# Patient Record
Sex: Female | Born: 1950 | Race: White | Hispanic: No | Marital: Married | Smoking: Never smoker
Health system: Southern US, Community
[De-identification: ages and names within clinical notes are randomized; demographics above are authoritative.]

## PROBLEM LIST (undated history)

## (undated) DIAGNOSIS — N200 Calculus of kidney: Secondary | ICD-10-CM

## (undated) DIAGNOSIS — M8580 Other specified disorders of bone density and structure, unspecified site: Secondary | ICD-10-CM

## (undated) DIAGNOSIS — C50919 Malignant neoplasm of unspecified site of unspecified female breast: Secondary | ICD-10-CM

## (undated) DIAGNOSIS — E039 Hypothyroidism, unspecified: Secondary | ICD-10-CM

## (undated) DIAGNOSIS — H269 Unspecified cataract: Secondary | ICD-10-CM

## (undated) DIAGNOSIS — M545 Low back pain, unspecified: Secondary | ICD-10-CM

## (undated) DIAGNOSIS — L719 Rosacea, unspecified: Secondary | ICD-10-CM

## (undated) DIAGNOSIS — F0390 Unspecified dementia without behavioral disturbance: Secondary | ICD-10-CM

## (undated) DIAGNOSIS — K219 Gastro-esophageal reflux disease without esophagitis: Secondary | ICD-10-CM

## (undated) HISTORY — DX: Gastro-esophageal reflux disease without esophagitis: K21.9

## (undated) HISTORY — DX: Other specified disorders of bone density and structure, unspecified site: M85.80

## (undated) HISTORY — PX: ABDOMINAL HYSTERECTOMY: SHX81

## (undated) HISTORY — DX: Low back pain, unspecified: M54.50

## (undated) HISTORY — DX: Calculus of kidney: N20.0

## (undated) HISTORY — DX: Hypothyroidism, unspecified: E03.9

## (undated) HISTORY — DX: Unspecified cataract: H26.9

## (undated) HISTORY — PX: OOPHORECTOMY: SHX86

## (undated) HISTORY — DX: Malignant neoplasm of unspecified site of unspecified female breast: C50.919

## (undated) HISTORY — DX: Low back pain: M54.5

## (undated) HISTORY — DX: Rosacea, unspecified: L71.9

---

## 1998-06-23 ENCOUNTER — Other Ambulatory Visit: Admission: RE | Admit: 1998-06-23 | Discharge: 1998-06-23 | Payer: Self-pay | Admitting: Gynecology

## 1998-08-09 ENCOUNTER — Ambulatory Visit (HOSPITAL_COMMUNITY): Admission: RE | Admit: 1998-08-09 | Discharge: 1998-08-09 | Payer: Self-pay | Admitting: Gynecology

## 1999-06-30 ENCOUNTER — Other Ambulatory Visit: Admission: RE | Admit: 1999-06-30 | Discharge: 1999-06-30 | Payer: Self-pay | Admitting: Gynecology

## 1999-09-01 ENCOUNTER — Encounter: Payer: Self-pay | Admitting: Gynecology

## 1999-09-01 ENCOUNTER — Ambulatory Visit (HOSPITAL_COMMUNITY): Admission: RE | Admit: 1999-09-01 | Discharge: 1999-09-01 | Payer: Self-pay | Admitting: Gynecology

## 2000-06-28 ENCOUNTER — Other Ambulatory Visit: Admission: RE | Admit: 2000-06-28 | Discharge: 2000-06-28 | Payer: Self-pay | Admitting: Gynecology

## 2001-04-22 ENCOUNTER — Encounter: Payer: Self-pay | Admitting: Gynecology

## 2001-04-22 ENCOUNTER — Ambulatory Visit (HOSPITAL_COMMUNITY): Admission: RE | Admit: 2001-04-22 | Discharge: 2001-04-22 | Payer: Self-pay | Admitting: Gynecology

## 2001-06-26 ENCOUNTER — Other Ambulatory Visit: Admission: RE | Admit: 2001-06-26 | Discharge: 2001-06-26 | Payer: Self-pay | Admitting: Gynecology

## 2001-07-01 ENCOUNTER — Encounter: Payer: Self-pay | Admitting: Gynecology

## 2001-07-01 ENCOUNTER — Encounter: Admission: RE | Admit: 2001-07-01 | Discharge: 2001-07-01 | Payer: Self-pay | Admitting: Gynecology

## 2001-08-20 ENCOUNTER — Encounter: Admission: RE | Admit: 2001-08-20 | Discharge: 2001-11-18 | Payer: Self-pay | Admitting: Gynecology

## 2002-04-24 ENCOUNTER — Encounter: Payer: Self-pay | Admitting: Gynecology

## 2002-04-24 ENCOUNTER — Ambulatory Visit (HOSPITAL_COMMUNITY): Admission: RE | Admit: 2002-04-24 | Discharge: 2002-04-24 | Payer: Self-pay | Admitting: Gynecology

## 2002-07-14 ENCOUNTER — Other Ambulatory Visit: Admission: RE | Admit: 2002-07-14 | Discharge: 2002-07-14 | Payer: Self-pay | Admitting: Gynecology

## 2003-04-27 ENCOUNTER — Encounter: Payer: Self-pay | Admitting: Gynecology

## 2003-04-27 ENCOUNTER — Ambulatory Visit (HOSPITAL_COMMUNITY): Admission: RE | Admit: 2003-04-27 | Discharge: 2003-04-27 | Payer: Self-pay | Admitting: Gynecology

## 2003-07-09 ENCOUNTER — Ambulatory Visit (HOSPITAL_COMMUNITY): Admission: RE | Admit: 2003-07-09 | Discharge: 2003-07-09 | Payer: Self-pay | Admitting: Gastroenterology

## 2003-07-29 ENCOUNTER — Other Ambulatory Visit: Admission: RE | Admit: 2003-07-29 | Discharge: 2003-07-29 | Payer: Self-pay | Admitting: Gynecology

## 2004-05-11 ENCOUNTER — Ambulatory Visit (HOSPITAL_COMMUNITY): Admission: RE | Admit: 2004-05-11 | Discharge: 2004-05-11 | Payer: Self-pay | Admitting: Gynecology

## 2004-08-01 ENCOUNTER — Other Ambulatory Visit: Admission: RE | Admit: 2004-08-01 | Discharge: 2004-08-01 | Payer: Self-pay | Admitting: Gynecology

## 2004-08-18 ENCOUNTER — Encounter: Admission: RE | Admit: 2004-08-18 | Discharge: 2004-08-18 | Payer: Self-pay | Admitting: Gynecology

## 2005-05-14 ENCOUNTER — Ambulatory Visit (HOSPITAL_COMMUNITY): Admission: RE | Admit: 2005-05-14 | Discharge: 2005-05-14 | Payer: Self-pay | Admitting: Gynecology

## 2005-07-18 ENCOUNTER — Encounter: Admission: RE | Admit: 2005-07-18 | Discharge: 2005-07-18 | Payer: Self-pay | Admitting: Gynecology

## 2005-08-07 ENCOUNTER — Other Ambulatory Visit: Admission: RE | Admit: 2005-08-07 | Discharge: 2005-08-07 | Payer: Self-pay | Admitting: Gynecology

## 2006-07-18 ENCOUNTER — Ambulatory Visit (HOSPITAL_COMMUNITY): Admission: RE | Admit: 2006-07-18 | Discharge: 2006-07-18 | Payer: Self-pay | Admitting: Gynecology

## 2006-08-08 ENCOUNTER — Ambulatory Visit (HOSPITAL_COMMUNITY): Admission: RE | Admit: 2006-08-08 | Discharge: 2006-08-08 | Payer: Self-pay | Admitting: Gynecology

## 2006-08-14 ENCOUNTER — Other Ambulatory Visit: Admission: RE | Admit: 2006-08-14 | Discharge: 2006-08-14 | Payer: Self-pay | Admitting: Gynecology

## 2006-08-16 ENCOUNTER — Ambulatory Visit (HOSPITAL_COMMUNITY): Admission: RE | Admit: 2006-08-16 | Discharge: 2006-08-16 | Payer: Self-pay | Admitting: Gynecology

## 2006-12-13 ENCOUNTER — Emergency Department (HOSPITAL_COMMUNITY): Admission: EM | Admit: 2006-12-13 | Discharge: 2006-12-13 | Payer: Self-pay | Admitting: Family Medicine

## 2007-02-11 ENCOUNTER — Emergency Department (HOSPITAL_COMMUNITY): Admission: EM | Admit: 2007-02-11 | Discharge: 2007-02-11 | Payer: Self-pay | Admitting: Family Medicine

## 2007-02-24 ENCOUNTER — Emergency Department (HOSPITAL_COMMUNITY): Admission: EM | Admit: 2007-02-24 | Discharge: 2007-02-24 | Payer: Self-pay | Admitting: Family Medicine

## 2007-07-24 ENCOUNTER — Ambulatory Visit (HOSPITAL_COMMUNITY): Admission: RE | Admit: 2007-07-24 | Discharge: 2007-07-24 | Payer: Self-pay | Admitting: Gynecology

## 2007-08-27 ENCOUNTER — Other Ambulatory Visit: Admission: RE | Admit: 2007-08-27 | Discharge: 2007-08-27 | Payer: Self-pay | Admitting: Gynecology

## 2007-09-03 ENCOUNTER — Encounter: Admission: RE | Admit: 2007-09-03 | Discharge: 2007-09-03 | Payer: Self-pay | Admitting: Gastroenterology

## 2008-04-26 ENCOUNTER — Ambulatory Visit (HOSPITAL_COMMUNITY): Admission: RE | Admit: 2008-04-26 | Discharge: 2008-04-26 | Payer: Self-pay | Admitting: Gastroenterology

## 2008-04-26 ENCOUNTER — Encounter (INDEPENDENT_AMBULATORY_CARE_PROVIDER_SITE_OTHER): Payer: Self-pay | Admitting: Gastroenterology

## 2008-08-13 ENCOUNTER — Emergency Department (HOSPITAL_COMMUNITY): Admission: EM | Admit: 2008-08-13 | Discharge: 2008-08-13 | Payer: Self-pay | Admitting: Family Medicine

## 2008-08-25 ENCOUNTER — Ambulatory Visit (HOSPITAL_COMMUNITY): Admission: RE | Admit: 2008-08-25 | Discharge: 2008-08-25 | Payer: Self-pay | Admitting: Gynecology

## 2008-09-02 ENCOUNTER — Encounter: Payer: Self-pay | Admitting: Gynecology

## 2008-09-02 ENCOUNTER — Other Ambulatory Visit: Admission: RE | Admit: 2008-09-02 | Discharge: 2008-09-02 | Payer: Self-pay | Admitting: Gynecology

## 2008-09-02 ENCOUNTER — Ambulatory Visit: Payer: Self-pay | Admitting: Gynecology

## 2009-04-01 HISTORY — PX: US ECHOCARDIOGRAPHY: HXRAD669

## 2009-10-12 ENCOUNTER — Ambulatory Visit (HOSPITAL_COMMUNITY): Admission: RE | Admit: 2009-10-12 | Discharge: 2009-10-12 | Payer: Self-pay | Admitting: Gynecology

## 2009-11-03 ENCOUNTER — Other Ambulatory Visit: Admission: RE | Admit: 2009-11-03 | Discharge: 2009-11-03 | Payer: Self-pay | Admitting: Gynecology

## 2009-11-03 ENCOUNTER — Ambulatory Visit: Payer: Self-pay | Admitting: Gynecology

## 2009-11-07 ENCOUNTER — Encounter: Admission: RE | Admit: 2009-11-07 | Discharge: 2009-11-22 | Payer: Self-pay | Admitting: Internal Medicine

## 2011-02-01 ENCOUNTER — Other Ambulatory Visit: Payer: Self-pay | Admitting: Gynecology

## 2011-02-01 DIAGNOSIS — Z1231 Encounter for screening mammogram for malignant neoplasm of breast: Secondary | ICD-10-CM

## 2011-02-09 ENCOUNTER — Ambulatory Visit (HOSPITAL_COMMUNITY)
Admission: RE | Admit: 2011-02-09 | Discharge: 2011-02-09 | Disposition: A | Discharge status: Home / Self Care | Payer: 59 | Source: Ambulatory Visit | Attending: Gynecology | Admitting: Gynecology

## 2011-02-09 DIAGNOSIS — Z1231 Encounter for screening mammogram for malignant neoplasm of breast: Secondary | ICD-10-CM | POA: Insufficient documentation

## 2011-02-13 ENCOUNTER — Encounter (INDEPENDENT_AMBULATORY_CARE_PROVIDER_SITE_OTHER): Payer: 59

## 2011-02-13 DIAGNOSIS — Z13820 Encounter for screening for osteoporosis: Secondary | ICD-10-CM

## 2011-02-13 NOTE — Op Note (Signed)
NAMEBRYSON, PALEN              ACCOUNT NO.:  [id number]   MEDICAL RECORD NO.:  [id number]          PATIENT TYPE:  AMB   LOCATION:  ENDO                         FACILITY:  [facility]   PHYSICIAN:  Anselmo Rod, M.D.  DATE OF BIRTH:  12-15-1950   DATE OF PROCEDURE:  04/26/2008  DATE OF DISCHARGE:                               OPERATIVE REPORT   PROCEDURE PERFORMED:  Esophagogastroduodenoscopy with multiple cold  biopsies.   ENDOSCOPIST:  Anselmo Rod, M.D.   INSTRUMENT USED:  Pentax video panendoscope.   INDICATIONS FOR PROCEDURE:  A 60 year old white female with a history of  reflux and some retrosternal discomfort undergoing EGD to rule out  peptic ulcer disease, esophagitis, gastritis, etc.   PREPROCEDURE PREPARATION:  Informed consent was procured from the  patient.  The patient fasted for 8 hours prior to the procedure.  Risks  and benefits of the procedure were discussed with the patient in detail.   PREPROCEDURE PHYSICAL:  VITAL SIGNS:  The patient had stable vital  signs.  NECK:  Supple.  CHEST:  Clear to auscultation.  HEART:  S1 and S2 regular.  ABDOMEN:  Soft with normal bowel sounds.   DESCRIPTION OF PROCEDURE:  The patient was placed in the left lateral  decubitus position and sedated with 50 mcg of Fentanyl and 5 mg of  Versed given intravenously in slow incremental doses.  Once the patient  was adequately sedated and maintained on low-flow oxygen and continuous  cardiac monitoring, the Pentax video panendoscope was advanced through  the mouthpiece, over the tongue and into the esophagus under direct  vision.  The vocal cords appeared healthy.  The entire esophagus was  widely patent with healthy-appearing Z-line.  No erosions, ulcerations,  esophagitis or Barrett's mucosa was identified.  The scope was then  advanced into the stomach.  Multiple small sessile polyps were seen in  the proximal stomach.  Some of these were biopsied for pathology.  Antral  gastritis was noted close to the pylorus.  Biopsies were done to  rule out presence of H. pylori by pathology.  The proximal small bowel  appeared normal.  There was no outlet obstruction.  The patient  tolerated the procedure well without complications.  Retroflexion in the  high cardia revealed no evidence of hiatal hernia.   IMPRESSION:  1. Healthy appearing vocal cords.  2. Widely patent esophagus.  3. Multiple small sessile polyps in the proximal stomach.  Biopsies      done.  4. Antral gastritis.  Biopsies done for Helicobacter pylori.  5. Normal proximal small bowel.   RECOMMENDATIONS:  1. Continue PPI.  2. Avoid all nonsteroidals for now.  3. Treat with antibiotics if there is evidence of H. pylori in      pathology.  4. Outpatient follow-up as need arises in the future.      Anselmo Rod, M.D.  Electronically Signed     JNM/MEDQ  D:  04/26/2008  T:  04/26/2008  Job:  [id number]   cc:   Talmadge Coventry, M.D.  Fax: [phone]

## 2011-02-14 ENCOUNTER — Other Ambulatory Visit (INDEPENDENT_AMBULATORY_CARE_PROVIDER_SITE_OTHER): Payer: 59

## 2011-02-14 DIAGNOSIS — M949 Disorder of cartilage, unspecified: Secondary | ICD-10-CM

## 2011-02-16 NOTE — Op Note (Signed)
   NAME:  Deborah Jones, Deborah Jones                        ACCOUNT NO.:  [id number]   MEDICAL RECORD NO.:  [id number]                   PATIENT TYPE:  AMB   LOCATION:  ENDO                                 FACILITY:  [facility]   PHYSICIAN:  Anselmo Rod, M.D.               DATE OF BIRTH:  1951-09-19   DATE OF PROCEDURE:  07/09/2003  DATE OF DISCHARGE:                                 OPERATIVE REPORT   PROCEDURE PERFORMED:  Screening colonoscopy.   ENDOSCOPIST:  Anselmo Rod, M.D.   INSTRUMENT USED:  Olympus video colonoscope.   INDICATION FOR PROCEDURE:  Family history of colon cancer in a 60 year old  white female undergoing screening colonoscopy.  Rule out colonic polyps,  masses, etc.   PREPROCEDURE PREPARATION:  Informed consent was procured from the patient.  The patient was fasted for eight hours prior to the procedure and prepped  with a bottle of Miralax and Gatorade the night prior to the procedure.   PREPROCEDURE PHYSICAL:  VITAL SIGNS:  The patient had stable vital signs.  NECK:  Supple.  CHEST:  Clear to auscultation.  S1, S2 regular.  ABDOMEN:  Soft with normal bowel sounds.   DESCRIPTION OF PROCEDURE:  The patient was placed in the left lateral  decubitus position and sedated with 50 mg of Demerol and 5 mg of Versed  intravenously.  Once the patient was adequately sedate and maintained on low-  flow oxygen and continuous cardiac monitoring, the Olympus video colonoscope  was advanced from the rectum to the cecum and terminal ileum with some  difficulty.  There was some residual stool in the colon, especially on the  right side.  Multiple washes were done.  The patient's position was changed  from the left lateral to the supine position to mobilize the stool pool.  The appendiceal orifice and the ileocecal valve were clearly visualized.  The terminal ileum appeared normal.  Except for small internal hemorrhoids  seen on retroflexion in the rectum, the entire colonic mucosa  up to the  terminal ileum appeared normal.   IMPRESSION:  Essentially normal colonoscopy including the terminal ileum,  except for small, nonbleeding internal hemorrhoids seen on retroflexion.   RECOMMENDATIONS:  1. Repeat CRC screening in the next five years unless the patient develops     any abnormal symptoms in the interim.  2. A high-fiber diet with liberal fluid intake.  3. Outpatient follow-up as the need arises in the future.                                               Anselmo Rod, M.D.    JNM/MEDQ  D:  07/09/2003  T:  07/09/2003  Job:  [id number]

## 2011-02-20 ENCOUNTER — Other Ambulatory Visit: Payer: Self-pay | Admitting: Gynecology

## 2011-02-20 ENCOUNTER — Encounter (INDEPENDENT_AMBULATORY_CARE_PROVIDER_SITE_OTHER): Payer: 59 | Admitting: Gynecology

## 2011-02-20 ENCOUNTER — Other Ambulatory Visit (HOSPITAL_COMMUNITY)
Admission: RE | Admit: 2011-02-20 | Discharge: 2011-02-20 | Disposition: A | Discharge status: Home / Self Care | Payer: 59 | Source: Ambulatory Visit | Attending: Gynecology | Admitting: Gynecology

## 2011-02-20 DIAGNOSIS — Z1211 Encounter for screening for malignant neoplasm of colon: Secondary | ICD-10-CM

## 2011-02-20 DIAGNOSIS — Z01419 Encounter for gynecological examination (general) (routine) without abnormal findings: Secondary | ICD-10-CM

## 2011-02-20 DIAGNOSIS — Z124 Encounter for screening for malignant neoplasm of cervix: Secondary | ICD-10-CM | POA: Insufficient documentation

## 2012-01-23 ENCOUNTER — Encounter: Payer: Self-pay | Admitting: *Deleted

## 2012-05-01 ENCOUNTER — Other Ambulatory Visit: Payer: Self-pay | Admitting: Gynecology

## 2012-05-01 DIAGNOSIS — Z1231 Encounter for screening mammogram for malignant neoplasm of breast: Secondary | ICD-10-CM

## 2012-05-16 ENCOUNTER — Ambulatory Visit (HOSPITAL_COMMUNITY)
Admission: RE | Admit: 2012-05-16 | Discharge: 2012-05-16 | Disposition: A | Discharge status: Home / Self Care | Payer: 59 | Source: Ambulatory Visit | Attending: Gynecology | Admitting: Gynecology

## 2012-05-16 DIAGNOSIS — Z1231 Encounter for screening mammogram for malignant neoplasm of breast: Secondary | ICD-10-CM | POA: Insufficient documentation

## 2012-06-24 ENCOUNTER — Other Ambulatory Visit (HOSPITAL_COMMUNITY)
Admission: RE | Admit: 2012-06-24 | Discharge: 2012-06-24 | Disposition: A | Discharge status: Home / Self Care | Payer: 59 | Source: Ambulatory Visit | Attending: Gynecology | Admitting: Gynecology

## 2012-06-24 ENCOUNTER — Encounter: Payer: Self-pay | Admitting: Gynecology

## 2012-06-24 ENCOUNTER — Ambulatory Visit (INDEPENDENT_AMBULATORY_CARE_PROVIDER_SITE_OTHER): Payer: 59 | Admitting: Gynecology

## 2012-06-24 VITALS — BP 114/70 | Ht 66.75 in | Wt 130.0 lb

## 2012-06-24 DIAGNOSIS — N952 Postmenopausal atrophic vaginitis: Secondary | ICD-10-CM | POA: Insufficient documentation

## 2012-06-24 DIAGNOSIS — Z01419 Encounter for gynecological examination (general) (routine) without abnormal findings: Secondary | ICD-10-CM

## 2012-06-24 DIAGNOSIS — M8580 Other specified disorders of bone density and structure, unspecified site: Secondary | ICD-10-CM | POA: Insufficient documentation

## 2012-06-24 DIAGNOSIS — M899 Disorder of bone, unspecified: Secondary | ICD-10-CM

## 2012-06-24 DIAGNOSIS — Z1151 Encounter for screening for human papillomavirus (HPV): Secondary | ICD-10-CM | POA: Insufficient documentation

## 2012-06-24 MED ORDER — ESTRADIOL 0.1 MG/GM VA CREA: 2.0000 g | TOPICAL_CREAM | VAGINAL | Status: DC

## 2012-06-24 NOTE — Progress Notes (Signed)
Deborah Jones 1951-08-16 [id number]   History:    61 y.o.  for annual gyn exam with no complaints today. Patient was recently started on pravastatin 20 mg daily by her primary physician. Review of her record indicated her mammogram was in August of this year which was normal. She frequently does her self breast examination. Her last bone density study was in may of 2012 with her lowest T score of -1.5 at the left femoral neck (decreased bone mineralization /osteopenia) with normal Frax analysis. Patient's currently taking vitamin D 3 1000 units daily. Her colonoscopy was 9 years ago no abnormalities detected. Her primary physician had received her Hemoccult cards that she had submitted to the office. Patient stated that about 20 years ago she had atypical squamous cells of undetermined significance on her Pap smear which on followup cleared. Patient with past history of total abdominal hysterectomy with bilateral salpingectomies and right salpingo-oophorectomy for endometriosis. Patient is using Estrace vaginal cream twice a week for vaginal atrophy.  Past medical history,surgical history, family history and social history were all reviewed and documented in the EPIC chart.  Gynecologic History No LMP recorded. Patient has had a hysterectomy. Contraception: none and Menopausal Last Pap: 2012. Results were: normal Last mammogram: 2013. Results were: normal  Obstetric History OB History    Grav Para Term Preterm Abortions TAB SAB Ect Mult Living   0                ROS: A ROS was performed and pertinent positives and negatives are included in the history.  GENERAL: No fevers or chills. HEENT: No change in vision, no earache, sore throat or sinus congestion. NECK: No pain or stiffness. CARDIOVASCULAR: No chest pain or pressure. No palpitations. PULMONARY: No shortness of breath, cough or wheeze. GASTROINTESTINAL: No abdominal pain, nausea, vomiting or diarrhea, melena or bright red blood per  rectum. GENITOURINARY: No urinary frequency, urgency, hesitancy or dysuria. MUSCULOSKELETAL: No joint or muscle pain, no back pain, no recent trauma. DERMATOLOGIC: No rash, no itching, no lesions. ENDOCRINE: No polyuria, polydipsia, no heat or cold intolerance. No recent change in weight. HEMATOLOGICAL: No anemia or easy bruising or bleeding. NEUROLOGIC: No headache, seizures, numbness, tingling or weakness. PSYCHIATRIC: No depression, no loss of interest in normal activity or change in sleep pattern.     Exam: chaperone present  BP 114/70  Ht 5' 6.75" (1.695 m)  Wt 130 lb (58.968 kg)  BMI 20.51 kg/m2  Body mass index is 20.51 kg/(m^2).  General appearance : Well developed well nourished female. No acute distress HEENT: Neck supple, trachea midline, no carotid bruits, no thyroidmegaly Lungs: Clear to auscultation, no rhonchi or wheezes, or rib retractions  Heart: Regular rate and rhythm, no murmurs or gallops Breast:Examined in sitting and supine position were symmetrical in appearance, no palpable masses or tenderness,  no skin retraction, no nipple inversion, no nipple discharge, no skin discoloration, no axillary or supraclavicular lymphadenopathy Abdomen: no palpable masses or tenderness, no rebound or guarding Extremities: no edema or skin discoloration or tenderness  Pelvic:  Bartholin, Urethra, Skene Glands: Within normal limits             Vagina: No gross lesions or discharge  Cervix: Absent  Uterus absent  Adnexa  Without masses or tenderness  Anus and perineum  normal   Rectovaginal  normal sphincter tone without palpated masses or tenderness             Hemoccult not done  Assessment/Plan:  61 y.o. female for annual exam who has done well with Estrace vaginal cream twice a week for vaginal atrophy. Patient's primary physician recently did her lab work so no lab work was done today. Pap smear was done today and I have explained to the patient that after today she will  no longer need Pap smears according to the new guidelines. She was encouraged to do her monthly self breast examination. We discussed importance of weightbearing exercises for osteoporosis prevention and she's now exercise again running regularly. Next year she will need a colonoscopy and mammogram..    Ok Edwards MD, 5:36 PM 06/24/2012

## 2012-06-24 NOTE — Patient Instructions (Addendum)
Health Maintenance, 77- to 60-Year-Old SCHOOL PERFORMANCE After high school completion, the young adult may be attending college, Scientist, product/process development or vocational school, or entering the [employer] or the work force. SOCIAL AND EMOTIONAL DEVELOPMENT The young adult establishes adult relationships and explores sexual identity. Young adults may be living at home or in a college dorm or apartment. Increasing independence is important with young adults. Throughout adolescence, teens should assume responsibility of their own health care. IMMUNIZATIONS Most young adults should be fully vaccinated. A booster dose of Tdap (tetanus, diphtheria, and pertussis, or "whooping cough"), a dose of meningococcal vaccine to protect against a certain type of bacterial meningitis, hepatitis A, human papillomarvirus (HPV), chickenpox, or measles vaccines may be indicated, if not given at an earlier age. Annual influenza or "flu" vaccination should be considered during flu season.  TESTING Annual screening for vision and hearing problems is recommended. Vision should be screened objectively at least once between 44 and 60 years of age. The young adult may be screened for anemia or tuberculosis. Young adults should have a blood test to check for high cholesterol during this time period. Young adults should be screened for use of alcohol and drugs. If the young adult is sexually active, screening for sexually transmitted infections, pregnancy, or HIV may be performed. Screening for cervical cancer should be performed within 3 years of beginning sexual activity. NUTRITION AND ORAL HEALTH  Adequate calcium intake is important. Consume 3 servings of low-fat milk and dairy products daily. For those who do not drink milk or consume dairy products, calcium enriched foods, such as juice, bread, or cereal, dark, leafy greens, or canned fish are alternate sources of calcium.   Drink plenty of water. Limit fruit juice to 8 to 12 ounces per day.  Avoid sugary beverages or sodas.   Discourage skipping meals, especially breakfast. Teens should eat a good variety of vegetables and fruits, as well as lean meats.   Avoid high fat, high salt, and high sugar foods, such as candy, chips, and cookies.   Encourage young adults to participate in meal planning and preparation.   Eat meals together as a family whenever possible. Encourage conversation at mealtime.   Limit fast food choices and eating out at restaurants.   Brush teeth twice a day and floss.   Schedule dental exams twice a year.  SLEEP Regular sleep habits are important. PHYSICAL, SOCIAL, AND EMOTIONAL DEVELOPMENT  One hour of regular physical activity daily is recommended. Continue to participate in sports.   Encourage young adults to develop their own interests and consider community service or volunteerism.   Provide guidance to the young adult in making decisions about college and work plans.   Make sure that young adults know that they should never be in a situation that makes them uncomfortable, and they should tell partners if they do not want to engage in sexual activity.   Talk to the young adult about body image. Eating disorders may be noted at this time. Young adults may also be concerned about being overweight. Monitor the young adult for weight gain or loss.   Mood disturbances, depression, anxiety, alcoholism, or attention problems may be noted in young adults. Talk to the caregiver if there are concerns about mental illness.   Negotiate limit setting and independent decision making.   Encourage the young adult to handle conflict without physical violence.   Avoid loud noises which may impair hearing.   Limit television and computer time to 2 hours per day.  Individuals who engage in excessive sedentary activity are more likely to become overweight.  RISK BEHAVIORS  Sexually active young adults need to take precautions against pregnancy and sexually  transmitted infections. Talk to young adults about contraception.   Provide a tobacco-free and drug-free environment for the young adult. Talk to the young adult about drug, tobacco, and alcohol use among friends or at friends' homes. Make sure the young adult knows that smoking tobacco or marijuana and taking drugs have health consequences and may impact brain development.   Teach the young adult about appropriate use of over-the-counter or prescription medicines.   Establish guidelines for driving and for riding with friends.   Talk to young adults about the risks of drinking and driving or boating. Encourage the young adult to call you if he or she or friends have been drinking or using drugs.   Remind young adults to wear seat belts at all times in cars and life vests in boats.   Young adults should always wear a properly fitted helmet when they are riding a bicycle.   Use caution with all-terrain vehicles (ATVs) or other motorized vehicles.   Do not keep handguns in the home. (If you do, the gun and ammunition should be locked separately and out of the young adult's access.)   Equip your home with smoke detectors and change the batteries regularly. Make sure all family members know the fire escape plans for your home.   Teach young adults not to swim alone and not to dive in shallow water.   All individuals should wear sunscreen that protects against UVA and UVB light with at least a sun protection factor (SPF) of 30 when out in the sun. This minimizes sun burning.  WHAT'S NEXT? Young adults should visit their pediatrician or family physician yearly. By young adulthood, health care should be transitioned to a family physician or internal medicine specialist. Sexually active females may want to begin annual physical exams with a gynecologist. Document Released: 12/13/2006 Document Revised: 09/06/2011 Document Reviewed: 01/02/2007 [facility] Patient Information 2012 [place],  [place].  Breast Self-Exam A self breast exam may help you find changes or problems while they are still small. Do a breast self-exam:  Every month.   One week after your period (menstrual period).   On the first day of each month if you do not have periods anymore.  Look for any:  Change in breast color, size, or shape.   Dimples in your breast.   Changes in your nipples or skin.   Dry skin on your breasts or nipples.   Watery or bloody discharge from your nipples.   Feel for:  Lumps.   Thick, hard places.   Any other changes.  HOME CARE There are 3 ways to do the breast self-exam: In front of a mirror.  Lift your arms over your head and turn side to side.   Put your hands on your hips and lean down, then turn from side to side.   Bend forward and turn from side to side.  In the shower.  With soapy hands, check both breasts. Then check above and below your collarbone and your armpits.   Feel above and below your collarbone down to under your breast, and from the center of your chest to the outer edge of the armpit. Check for any lumps or hard spots.   Using the tips of your middle three fingers check your whole breast by pressing your hand over your breast in  a circle or in an up and down motion.  Lying down.  Lie flat on your bed.   Put a small pillow under the breast you are going to check. On that same side, put your hand behind your head.   With your other hand, use the 3 middle fingers to feel the breast.   Move your fingers in a circle around the breast. Press firmly over all parts of the breast to feel for any lumps.  GET HELP RIGHT AWAY IF: You find any changes in your breasts so they can be checked. Document Released: 03/05/2008 Document Revised: 09/06/2011 Document Reviewed: 01/05/2009 [facility] Patient Information 2012 [place], [place].

## 2012-06-30 ENCOUNTER — Encounter: Payer: Self-pay | Admitting: Gynecology

## 2013-04-30 ENCOUNTER — Encounter: Payer: Self-pay | Admitting: Cardiology

## 2013-05-08 ENCOUNTER — Other Ambulatory Visit: Payer: Self-pay | Admitting: Gynecology

## 2013-05-08 DIAGNOSIS — Z1231 Encounter for screening mammogram for malignant neoplasm of breast: Secondary | ICD-10-CM

## 2013-05-19 ENCOUNTER — Ambulatory Visit (HOSPITAL_COMMUNITY)
Admission: RE | Admit: 2013-05-19 | Discharge: 2013-05-19 | Disposition: A | Discharge status: Home / Self Care | Payer: 59 | Source: Ambulatory Visit | Attending: Gynecology | Admitting: Gynecology

## 2013-05-19 DIAGNOSIS — Z1231 Encounter for screening mammogram for malignant neoplasm of breast: Secondary | ICD-10-CM | POA: Insufficient documentation

## 2013-06-30 ENCOUNTER — Encounter: Payer: 59 | Admitting: Gynecology

## 2013-07-02 ENCOUNTER — Encounter: Payer: Self-pay | Admitting: Gynecology

## 2013-07-09 ENCOUNTER — Ambulatory Visit (INDEPENDENT_AMBULATORY_CARE_PROVIDER_SITE_OTHER): Payer: 59 | Admitting: Gynecology

## 2013-07-09 ENCOUNTER — Encounter: Payer: Self-pay | Admitting: Gynecology

## 2013-07-09 VITALS — BP 114/72 | Ht 66.75 in | Wt 128.0 lb

## 2013-07-09 DIAGNOSIS — Z1159 Encounter for screening for other viral diseases: Secondary | ICD-10-CM

## 2013-07-09 DIAGNOSIS — M8580 Other specified disorders of bone density and structure, unspecified site: Secondary | ICD-10-CM

## 2013-07-09 DIAGNOSIS — Z79890 Hormone replacement therapy (postmenopausal): Secondary | ICD-10-CM

## 2013-07-09 DIAGNOSIS — Z01419 Encounter for gynecological examination (general) (routine) without abnormal findings: Secondary | ICD-10-CM

## 2013-07-09 DIAGNOSIS — N952 Postmenopausal atrophic vaginitis: Secondary | ICD-10-CM

## 2013-07-09 DIAGNOSIS — M899 Disorder of bone, unspecified: Secondary | ICD-10-CM

## 2013-07-09 MED ORDER — ESTRADIOL 0.1 MG/GM VA CREA: 2.0000 g | TOPICAL_CREAM | VAGINAL | Status: DC

## 2013-07-09 NOTE — Progress Notes (Signed)
Deborah Jones 1950/10/21 [id number]   History:    62 y.o.  for annual gyn exam who has been complaining of bloating and gas-like symptoms especially after eating. Review of her record indicated that she had endoscopy approximately 5 years ago and erosive gastritis and gastric polyp had been removed. Patient had informed me that prior to that she was treated for H. Pylori. Her last colonoscopy was normal in 2010. Patient denies any hematochezia. Patient's PCP is Dr. Jarold Motto who is treating the patient for hyperlipidemia for which she's currently on pravastatin 20 mg daily. He has been doing her lab work. 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 Recent mammogram August this year was normal.Her last bone density study was in may of 2012 with her lowest T score of -1.5 at the left femoral neck (decreased bone mineralization /osteopenia) with normal Frax analysis.Patient stated that about 20 years ago she had atypical squamous cells of undetermined significance on her Pap smear with followup Pap smears being normal.Patient with past history of total abdominal hysterectomy with bilateral salpingectomies and right salpingo-oophorectomy for endometriosis. Patient is using Estrace vaginal cream twice a week for vaginal atrophy.        Past medical history,surgical history, family history and social history were all reviewed and documented in the EPIC chart.  Gynecologic History No LMP recorded. Patient has had a hysterectomy. Contraception: status post hysterectomy Last Pap: 2013. Results were: normal Last mammogram: 2014. Results were: normal  Obstetric History OB History  Gravida Para Term Preterm AB SAB TAB Ectopic Multiple Living  0                  ROS: A ROS was performed and pertinent positives and negatives are included in the history.  GENERAL: No fevers or chills. HEENT: No change in vision, no earache, sore throat or sinus congestion. NECK: No pain or  stiffness. CARDIOVASCULAR: No chest pain or pressure. No palpitations. PULMONARY: No shortness of breath, cough or wheeze. GASTROINTESTINAL: No abdominal pain, nausea, vomiting or diarrhea, melena or bright red blood per rectum. GENITOURINARY: No urinary frequency, urgency, hesitancy or dysuria. MUSCULOSKELETAL: No joint or muscle pain, no back pain, no recent trauma. DERMATOLOGIC: No rash, no itching, no lesions. ENDOCRINE: No polyuria, polydipsia, no heat or cold intolerance. No recent change in weight. HEMATOLOGICAL: No anemia or easy bruising or bleeding. NEUROLOGIC: No headache, seizures, numbness, tingling or weakness. PSYCHIATRIC: No depression, no loss of interest in normal activity or change in sleep pattern.     Exam: chaperone present  BP 114/72  Ht 5' 6.75" (1.695 m)  Wt 128 lb (58.06 kg)  BMI 20.21 kg/m2  Body mass index is 20.21 kg/(m^2).  General appearance : Well developed well nourished female. No acute distress HEENT: Neck supple, trachea midline, no carotid bruits, no thyroidmegaly Lungs: Clear to auscultation, no rhonchi or wheezes, or rib retractions  Heart: Regular rate and rhythm, no murmurs or gallops Breast:Examined in sitting and supine position were symmetrical in appearance, no palpable masses or tenderness,  no skin retraction, no nipple inversion, no nipple discharge, no skin discoloration, no axillary or supraclavicular lymphadenopathy Abdomen: no palpable masses or tenderness, no rebound or guarding Extremities: no edema or skin discoloration or tenderness  Pelvic:  Bartholin, Urethra, Skene Glands: Within normal limits       Vagina: No gross lesions or discharge  Cervix:absent  Uterus Absent  Adnexa  Without masses or tenderness  Anus and perineum  normal   Rectovaginal  normal sphincter tone without palpated masses or tenderness             Hemoccult PCP provides     Assessment/Plan:  62 y.o. female for annual exam doing well on Estrace vaginal cream  twice a week. Patient's PCP has been during her lab work. Patient with history of osteopenia followup bone density study will be scheduled here at our office the next 2 weeks.  New CDC guidelines is recommending patients be tested once in her lifetime for hepatitis C antibody who were born between [birth year] through 1964. This was discussed with the patient today and has agreed to be tested today.  The patient was reminded to followup with her gastroenterologist in reference to her abdominal bloating after meals. Patient with prior history of H. Pylori as well as gastritis and gastric polyp. If the workup is negative by the gastroenterologist I recommend we followup with a pelvic ultrasound to look at her main ovary. No pelvic abnormality on exam today. Prescription refill for the Estrace vaginal cream was provided. We discussed importance of calcium and vitamin D with regular exercise for osteoporosis prevention.  Note: This dictation was prepared with  Dragon/digital dictation along withSmart phrase technology. Any transcriptional errors that result from this process are unintentional.   Ok Edwards MD, 12:00 PM 07/09/2013

## 2013-07-09 NOTE — Patient Instructions (Signed)
Bone Densitometry Bone densitometry is a special X-ray that measures your bone density and can be used to help predict your risk of bone fractures. This test is used to determine bone mineral content and density to diagnose osteoporosis. Osteoporosis is the loss of bone that may cause the bone to become weak. Osteoporosis commonly occurs in women entering menopause. However, it may be found in men and in people with other diseases. PREPARATION FOR TEST No preparation necessary. WHO SHOULD BE TESTED?  All women older than 65.  Postmenopausal women (50 to 65) with risk factors for osteoporosis.  People with a previous fracture caused by normal activities.  People with a small body frame (less than 127 poundsor a body mass index [BMI] of less than 21).  People who have a parent with a hip fracture or history of osteoporosis.  People who smoke.  People who have rheumatoid arthritis.  Anyone who engages in excessive alcohol use (more than 3 drinks most days).  Women who experience early menopause. WHEN SHOULD YOU BE RETESTED? Current guidelines suggest that you should wait at least 2 years before doing a bone density test again if your first test was normal.Recent studies indicated that women with normal bone density may be able to wait a few years before needing to repeat a bone density test. You should discuss this with your caregiver.  NORMAL FINDINGS   Normal: less than standard deviation below normal (greater than -1).  Osteopenia: 1 to 2.5 standard deviations below normal (-1 to -2.5).  Osteoporosis: greater than 2.5 standard deviations below normal (less than -2.5). Test results are reported as a "T score" and a "Z score."The T score is a number that compares your bone density with the bone density of healthy, young women.The Z score is a number that compares your bone density with the scores of women who are the same age, gender, and race.  Ranges for normal findings may vary  among different laboratories and hospitals. You should always check with your doctor after having lab work or other tests done to discuss the meaning of your test results and whether your values are considered within normal limits. MEANING OF TEST  Your caregiver will go over the test results with you and discuss the importance and meaning of your results, as well as treatment options and the need for additional tests if necessary. OBTAINING THE TEST RESULTS It is your responsibility to obtain your test results. Ask the lab or department performing the test when and how you will get your results. Document Released: 10/09/2004 Document Revised: 12/10/2011 Document Reviewed: 11/01/2010 [employer] Patient Information 2014 [employer].  

## 2013-07-10 LAB — HEPATITIS C ANTIBODY: HCV Ab: NEGATIVE

## 2013-08-06 ENCOUNTER — Other Ambulatory Visit: Payer: Self-pay

## 2013-08-25 ENCOUNTER — Ambulatory Visit (INDEPENDENT_AMBULATORY_CARE_PROVIDER_SITE_OTHER): Payer: 59

## 2013-08-25 DIAGNOSIS — M899 Disorder of bone, unspecified: Secondary | ICD-10-CM

## 2013-08-25 DIAGNOSIS — M8580 Other specified disorders of bone density and structure, unspecified site: Secondary | ICD-10-CM

## 2013-12-15 ENCOUNTER — Other Ambulatory Visit: Payer: Self-pay | Admitting: *Deleted

## 2013-12-15 DIAGNOSIS — R140 Abdominal distension (gaseous): Secondary | ICD-10-CM

## 2014-01-06 ENCOUNTER — Encounter: Payer: Self-pay | Admitting: Gynecology

## 2014-01-06 ENCOUNTER — Ambulatory Visit (INDEPENDENT_AMBULATORY_CARE_PROVIDER_SITE_OTHER): Payer: 59

## 2014-01-06 ENCOUNTER — Ambulatory Visit (INDEPENDENT_AMBULATORY_CARE_PROVIDER_SITE_OTHER): Payer: 59 | Admitting: Gynecology

## 2014-01-06 ENCOUNTER — Other Ambulatory Visit: Payer: Self-pay | Admitting: Gynecology

## 2014-01-06 VITALS — BP 120/76

## 2014-01-06 DIAGNOSIS — R142 Eructation: Secondary | ICD-10-CM

## 2014-01-06 DIAGNOSIS — K589 Irritable bowel syndrome without diarrhea: Secondary | ICD-10-CM

## 2014-01-06 DIAGNOSIS — N9489 Other specified conditions associated with female genital organs and menstrual cycle: Secondary | ICD-10-CM

## 2014-01-06 DIAGNOSIS — R102 Pelvic and perineal pain: Secondary | ICD-10-CM

## 2014-01-06 DIAGNOSIS — N83339 Acquired atrophy of ovary and fallopian tube, unspecified side: Secondary | ICD-10-CM

## 2014-01-06 DIAGNOSIS — R140 Abdominal distension (gaseous): Secondary | ICD-10-CM

## 2014-01-06 DIAGNOSIS — R143 Flatulence: Secondary | ICD-10-CM

## 2014-01-06 DIAGNOSIS — R141 Gas pain: Secondary | ICD-10-CM

## 2014-01-06 DIAGNOSIS — Z8742 Personal history of other diseases of the female genital tract: Secondary | ICD-10-CM

## 2014-01-06 NOTE — Progress Notes (Signed)
   Patient presented to the office today to discuss her ultrasound. Patient was seen in the office on 07/09/2013 for her annual gynecological exam. Patient had been complaining of bloating and gas-like symptoms especially after eating. She recently saw her gastroenterologist and she states that she had a normal colonoscopy. Patient had history of erosive gastritis approximately 6 years ago and gastric polyp had been removed. Prior to that she been treated for H. pylori. She waited for the followup ultrasound until today until her GI evaluation was completed. She had denied any history of hematochezia.Patient with past history of total abdominal hysterectomy with bilateral salpingectomies and right salpingo-oophorectomy for endometriosis. Patient is using Estrace vaginal cream twice a week for vaginal atrophy.  Ultrasound today: Prior history of hysterectomy with right salpingo-oophorectomy and partial left salpingo-oophorectomy. Right ovary not seen. Left ovary seen but was atrophic normal echo pattern. No apparent adnexal masses on the right or left. Excessive bowel activity was noted during the exam.  Patient's symptomatology according to her gastric rod may be attributed to IBS she has been put on probe in the past and now she has informed me there going to start her on Rifaxan 550 mg 3 times a day. She will continue to followup with her gastroenterologist. I've asked her to keep a diary of her symptoms to see if she can pinpoint any particular food or drink in her diet and may be causing this bloating sensation. She is otherwise scheduled to see me at the end of the year for gynecological exam.

## 2014-06-01 ENCOUNTER — Other Ambulatory Visit: Payer: Self-pay | Admitting: Gynecology

## 2014-06-01 DIAGNOSIS — Z1231 Encounter for screening mammogram for malignant neoplasm of breast: Secondary | ICD-10-CM

## 2014-06-08 ENCOUNTER — Ambulatory Visit (HOSPITAL_COMMUNITY)
Admission: RE | Admit: 2014-06-08 | Discharge: 2014-06-08 | Disposition: A | Discharge status: Home / Self Care | Payer: 59 | Source: Ambulatory Visit | Attending: Gynecology | Admitting: Gynecology

## 2014-06-08 DIAGNOSIS — Z1231 Encounter for screening mammogram for malignant neoplasm of breast: Secondary | ICD-10-CM | POA: Diagnosis present

## 2015-06-03 ENCOUNTER — Ambulatory Visit (INDEPENDENT_AMBULATORY_CARE_PROVIDER_SITE_OTHER): Payer: 59 | Admitting: Family Medicine

## 2015-06-03 VITALS — BP 110/68 | HR 49 | Temp 97.7°F | Resp 16 | Ht 66.5 in | Wt 126.0 lb

## 2015-06-03 DIAGNOSIS — R3129 Other microscopic hematuria: Secondary | ICD-10-CM

## 2015-06-03 DIAGNOSIS — R312 Other microscopic hematuria: Secondary | ICD-10-CM | POA: Diagnosis not present

## 2015-06-03 DIAGNOSIS — R1032 Left lower quadrant pain: Secondary | ICD-10-CM | POA: Diagnosis not present

## 2015-06-03 LAB — POCT CBC
Granulocyte percent: 84.9 %G — AB (ref 37–80)
HEMATOCRIT: 39.9 % (ref 37.7–47.9)
Hemoglobin: 12.7 g/dL (ref 12.2–16.2)
LYMPH, POC: 1.1 (ref 0.6–3.4)
MCH, POC: 29.3 pg (ref 27–31.2)
MCHC: 31.9 g/dL (ref 31.8–35.4)
MCV: 92 fL (ref 80–97)
MID (cbc): 0.3 (ref 0–0.9)
MPV: 7.2 fL (ref 0–99.8)
POC GRANULOCYTE: 7.9 — AB (ref 2–6.9)
POC LYMPH PERCENT: 11.5 %L (ref 10–50)
POC MID %: 3.6 % (ref 0–12)
Platelet Count, POC: 210 10*3/uL (ref 142–424)
RBC: 4.34 M/uL (ref 4.04–5.48)
RDW, POC: 13.6 %
WBC: 9.3 10*3/uL (ref 4.6–10.2)

## 2015-06-03 LAB — BASIC METABOLIC PANEL
BUN: 22 mg/dL (ref 7–25)
CHLORIDE: 104 mmol/L (ref 98–110)
CO2: 26 mmol/L (ref 20–31)
CREATININE: 0.77 mg/dL (ref 0.50–0.99)
Calcium: 9.3 mg/dL (ref 8.6–10.4)
Glucose, Bld: 89 mg/dL (ref 65–99)
POTASSIUM: 4.8 mmol/L (ref 3.5–5.3)
SODIUM: 143 mmol/L (ref 135–146)

## 2015-06-03 LAB — POCT URINALYSIS DIPSTICK
Bilirubin, UA: NEGATIVE
Glucose, UA: NEGATIVE
KETONES UA: 15
Leukocytes, UA: NEGATIVE
Nitrite, UA: NEGATIVE
PH UA: 6
SPEC GRAV UA: 1.02
UROBILINOGEN UA: 0.2

## 2015-06-03 LAB — POCT WET PREP WITH KOH
Clue Cells Wet Prep HPF POC: NEGATIVE
KOH PREP POC: NEGATIVE
TRICHOMONAS UA: NEGATIVE
WBC WET PREP PER HPF POC: NEGATIVE
Yeast Wet Prep HPF POC: NEGATIVE

## 2015-06-03 LAB — HEMOCCULT GUIAC POC 1CARD (OFFICE): FECAL OCCULT BLD: NEGATIVE

## 2015-06-03 LAB — POCT UA - MICROSCOPIC ONLY
Bacteria, U Microscopic: NEGATIVE
CRYSTALS, UR, HPF, POC: NEGATIVE
Casts, Ur, LPF, POC: NEGATIVE
Mucus, UA: POSITIVE
WBC, Ur, HPF, POC: NEGATIVE
YEAST UA: NEGATIVE

## 2015-06-03 MED ORDER — HYDROCODONE-ACETAMINOPHEN 5-325 MG PO TABS: 1.0000 | ORAL_TABLET | ORAL | Status: DC | PRN

## 2015-06-03 MED ORDER — TAMSULOSIN HCL 0.4 MG PO CAPS: 0.4000 mg | ORAL_CAPSULE | Freq: Every day | ORAL | Status: DC

## 2015-06-03 NOTE — Progress Notes (Signed)
Subjective:    Patient ID: Deborah Jones, female    DOB: 11/09/1950, 64 y.o.   MRN: [id number] Chief Complaint  Patient presents with  . Abdominal Pain    LLQ since this morning  . Diarrhea    HPI  Woke this morning and was feeling a little blooated lower  Which is not unusal for her as she has a IBS-type bowels - both constipation and diarrhea.  This morning had a little diarrhea but prior few days bowels have been normal.  She went out walking but developing bilateral abdominal pressure which then localized to the left lower quadrant and pain was so severe - felt like a balloon was growing in her causing unbearable pressure - the pain was so severe that it was causing clammy and lightheadedness.  Took some aleve which helped so pain still present but improved. No urine sxs, no n/v. No blood in stools or melena. No gerd/pud sxs.  Did have a colonoscopy from Dr. Loreta Ave in February.  She biopsied several areas which were normal but repeat in 7 yrs as pt's grandmother had colon cancer.  In 1990s had LLQ pain that ended up causing endometriosis which was impinging on left ureter causing left hydronephrosis.  Pt has hysterectomy and Rt ovarian removal due to cyst  Pt saw gyn about her lower abd pressure about a year ago who recommended pelvic US to r/u ovarianian cancer and [place] was normal about a year aog - this was due to increasing bowel gas and gurgling.  Has tried different probiotics  Past Medical History  Diagnosis Date  . Hypothyroidism   . Rosacea   . Endometriosis   . GERD (gastroesophageal reflux disease)   . Low back pain     STARTED IN JULY 2012   Current Outpatient Prescriptions on File Prior to Visit  Medication Sig Dispense Refill  . Cholecalciferol (VITAMIN D3) 2000 UNITS TABS Take by mouth daily.    Marland Kitchen estradiol (ESTRACE) 0.1 MG/GM vaginal cream Place 0.25 Applicatorfuls vaginally 2 (two) times a week. 42.5 g 8  . levothyroxine (SYNTHROID, LEVOTHROID) 50 MCG tablet Take  50 mcg by mouth daily.    . metroNIDAZOLE (METROCREAM) 0.75 % cream Apply topically 2 (two) times daily.    . Multiple Vitamin (MULTIVITAMIN) tablet Take 1 tablet by mouth daily.    . pravastatin (PRAVACHOL) 20 MG tablet Take 20 mg by mouth daily.    . Calcium Carbonate-Vit D-Min (CALCIUM 1200 PO) Take by mouth daily.    . pantoprazole (PROTONIX) 40 MG tablet Take 40 mg by mouth as needed.    . Probiotic Product (ALIGN) 4 MG CAPS Take by mouth.     No current facility-administered medications on file prior to visit.   No Known Allergies  Review of Systems  Constitutional: Positive for chills, diaphoresis, activity change and appetite change. Negative for fever and unexpected weight change.  Respiratory: Negative for shortness of breath.   Cardiovascular: Negative for chest pain and leg swelling.  Gastrointestinal: Positive for nausea, abdominal pain, diarrhea, constipation, blood in stool and abdominal distention. Negative for vomiting.  Genitourinary: Positive for hematuria and pelvic pain. Negative for dysuria, urgency, frequency, flank pain, decreased urine volume, vaginal bleeding, vaginal discharge, enuresis, difficulty urinating, genital sores, vaginal pain and menstrual problem.  Musculoskeletal: Negative for gait problem.  Skin: Negative for rash.  Neurological: Positive for light-headedness. Negative for dizziness and syncope.  Hematological: Negative for adenopathy.  Psychiatric/Behavioral: Negative for sleep disturbance.  Objective:  BP 110/68 mmHg  Pulse 49  Temp(Src) 97.7 F (36.5 C) (Oral)  Resp 16  Ht 5' 6.5" (1.689 m)  Wt 126 lb (57.153 kg)  BMI 20.03 kg/m2  SpO2 98%  Physical Exam  Constitutional: She is oriented to person, place, and time. She appears well-developed and well-nourished. No distress.  HENT:  Head: Normocephalic and atraumatic.  Cardiovascular: Normal rate, regular rhythm, normal heart sounds and intact distal pulses.   Pulmonary/Chest:  Effort normal and breath sounds normal.  Abdominal: Soft. Normal appearance. She exhibits no distension and no mass. Bowel sounds are increased. There is no hepatosplenomegaly. There is no tenderness. There is no rigidity, no rebound, no guarding and no CVA tenderness. No hernia.  Genitourinary: Rectum normal and vagina normal. Rectal exam shows no mass and no tenderness. Guaiac negative stool. Pelvic exam was performed with patient supine. There is no rash, tenderness or lesion on the right labia. There is no rash, tenderness or lesion on the left labia. Right adnexum displays no mass, no tenderness and no fullness. Left adnexum displays no mass, no tenderness and no fullness. No erythema, tenderness or bleeding in the vagina. No vaginal discharge found.  Cervix, uterus, right ovary surgically absent  Lymphadenopathy:       Right: No inguinal adenopathy present.       Left: No inguinal adenopathy present.  Neurological: She is alert and oriented to person, place, and time.  Skin: Skin is warm and dry. She is not diaphoretic.  Psychiatric: She has a normal mood and affect. Her behavior is normal.          Assessment & Plan:   1. Abdominal pain, left lower quadrant   2. Hematuria, microscopic    Strongly suspect kidney stone so push fluids, start flomax, strain urine and return any stones/sediment for lab eval as no h/o stones prior.  If pain continues w/o starting to migrate towards bladder or pain starts to persist or worsen rather than colic pattern, then would advise imaging - non-contrast abd/pelvic CT recommended - but pt may elect to start with xray due to her concerns of radiation exposure.  Ddx also includes ovarian pathology (though normal bimanual exam) or diverticulitis (though neg hemoccult and wbc) vs constipation with some diarrhea around stool ball  Orders Placed This Encounter  Procedures  . Urine culture  . Basic metabolic panel    Order Specific Question:  Has the  patient fasted?    Answer:  No  . POCT CBC  . Hemoccult - 1 Card (office)  . POCT UA - Microscopic Only  . POCT urinalysis dipstick  . POCT Wet Prep with KOH   Results for orders placed or performed in visit on 06/03/15  Basic metabolic panel  Result Value Ref Range   Sodium 143 135 - 146 mmol/L   Potassium 4.8 3.5 - 5.3 mmol/L   Chloride 104 98 - 110 mmol/L   CO2 26 20 - 31 mmol/L   Glucose, Bld 89 65 - 99 mg/dL   BUN 22 7 - 25 mg/dL   Creat 0.98 1.19 - 1.47 mg/dL   Calcium 9.3 8.6 - 82.9 mg/dL  POCT CBC  Result Value Ref Range   WBC 9.3 4.6 - 10.2 K/uL   Lymph, poc 1.1 0.6 - 3.4   POC LYMPH PERCENT 11.5 10 - 50 %L   MID (cbc) 0.3 0 - 0.9   POC MID % 3.6 0 - 12 %M   POC Granulocyte 7.9 (  A) 2 - 6.9   Granulocyte percent 84.9 (A) 37 - 80 %G   RBC 4.34 4.04 - 5.48 M/uL   Hemoglobin 12.7 12.2 - 16.2 g/dL   HCT, POC 16.1 09.6 - 47.9 %   MCV 92.0 80 - 97 fL   MCH, POC 29.3 27 - 31.2 pg   MCHC 31.9 31.8 - 35.4 g/dL   RDW, POC 04.5 %   Platelet Count, POC 210 142 - 424 K/uL   MPV 7.2 0 - 99.8 fL  Hemoccult - 1 Card (office)  Result Value Ref Range   Fecal Occult Blood, POC Negative Negative   Card #1 Date 06/03/2015    Card #2 Fecal Occult Blod, POC     Card #2 Date     Card #3 Fecal Occult Blood, POC     Card #3 Date    POCT UA - Microscopic Only  Result Value Ref Range   WBC, Ur, HPF, POC Negative    RBC, urine, microscopic 3-16    Bacteria, U Microscopic Negative    Mucus, UA Positive    Epithelial cells, urine per micros 0-1    Crystals, Ur, HPF, POC Negative    Casts, Ur, LPF, POC Negative    Yeast, UA Negative   POCT urinalysis dipstick  Result Value Ref Range   Color, UA [place] Yellow    Clarity, UA Clear    Glucose, UA Negative    Bilirubin, UA Negative    Ketones, UA 15    Spec Grav, UA 1.020    Blood, UA Moderate    pH, UA 6.0    Protein, UA Trace    Urobilinogen, UA 0.2    Nitrite, UA Negative    Leukocytes, UA Negative Negative  POCT Wet Prep  with KOH  Result Value Ref Range   Trichomonas, UA Negative    Clue Cells Wet Prep HPF POC Negative    Epithelial Wet Prep HPF POC Few Few, Moderate, Many   Yeast Wet Prep HPF POC Negatvie    Bacteria Wet Prep HPF POC Many (A) None, Few   RBC Wet Prep HPF POC 1-3    WBC Wet Prep HPF POC Negatvie    KOH Prep POC Negative     Meds ordered this encounter  Medications  . tamsulosin (FLOMAX) 0.4 MG CAPS capsule    Sig: Take 1 capsule (0.4 mg total) by mouth daily.    Dispense:  30 capsule    Refill:  0  . HYDROcodone-acetaminophen (NORCO/VICODIN) 5-325 MG per tablet    Sig: Take 1-2 tablets by mouth every 4 (four) hours as needed for moderate pain.    Dispense:  30 tablet    Refill:  0   Over 45 min spent in face-to-face evaluation of and consultation with patient and coordination of care.  Over 50% of this time was spent counseling this patient.  Norberto Sorenson, MD MPH

## 2015-06-03 NOTE — Patient Instructions (Signed)

## 2015-06-08 ENCOUNTER — Encounter: Payer: 59 | Admitting: Gynecology

## 2015-06-08 ENCOUNTER — Telehealth: Payer: Self-pay

## 2015-06-08 NOTE — Telephone Encounter (Signed)
-----   Message from Sherren Mocha, MD sent at 06/07/2015 11:04 PM EDT ----- Will you please let pt know that after her symptoms resolve, she should have her urine rechecked to make sure the blood in it resolves and if she is still having any pain or discomfort she should see her PCP or come back in to see me asap. Thanks!  Carley Hammed ----- Message -----    From: Johnnette Litter, CMA    Sent: 06/07/2015   3:18 PM      To: Sherren Mocha, MD  [facility] unable to run Urine Culture. It was not labeled with a name and DOB. Anything I can do for you?

## 2015-06-10 NOTE — Addendum Note (Signed)
Addended by: Areta Haber B on: 06/10/2015 10:33 AM   Modules accepted: Orders

## 2015-06-13 ENCOUNTER — Telehealth: Payer: Self-pay | Admitting: Family Medicine

## 2015-06-13 ENCOUNTER — Ambulatory Visit (INDEPENDENT_AMBULATORY_CARE_PROVIDER_SITE_OTHER): Payer: 59 | Admitting: Gynecology

## 2015-06-13 ENCOUNTER — Other Ambulatory Visit (HOSPITAL_COMMUNITY)
Admission: RE | Admit: 2015-06-13 | Discharge: 2015-06-13 | Disposition: A | Discharge status: Home / Self Care | Payer: 59 | Source: Ambulatory Visit | Attending: Gynecology | Admitting: Gynecology

## 2015-06-13 ENCOUNTER — Encounter: Payer: Self-pay | Admitting: Gynecology

## 2015-06-13 VITALS — BP 124/80 | Ht 67.0 in | Wt 127.0 lb

## 2015-06-13 DIAGNOSIS — N952 Postmenopausal atrophic vaginitis: Secondary | ICD-10-CM

## 2015-06-13 DIAGNOSIS — Z01419 Encounter for gynecological examination (general) (routine) without abnormal findings: Secondary | ICD-10-CM | POA: Insufficient documentation

## 2015-06-13 DIAGNOSIS — Z23 Encounter for immunization: Secondary | ICD-10-CM

## 2015-06-13 DIAGNOSIS — M8580 Other specified disorders of bone density and structure, unspecified site: Secondary | ICD-10-CM

## 2015-06-13 DIAGNOSIS — N3001 Acute cystitis with hematuria: Secondary | ICD-10-CM

## 2015-06-13 DIAGNOSIS — Z1151 Encounter for screening for human papillomavirus (HPV): Secondary | ICD-10-CM | POA: Diagnosis not present

## 2015-06-13 LAB — URINALYSIS W MICROSCOPIC + REFLEX CULTURE
BILIRUBIN URINE: NEGATIVE
CASTS: NONE SEEN [LPF]
Crystals: NONE SEEN [HPF]
Glucose, UA: NEGATIVE
KETONES UR: NEGATIVE
Leukocytes, UA: NEGATIVE
NITRITE: NEGATIVE
PH: 5 (ref 5.0–8.0)
Protein, ur: NEGATIVE
SPECIFIC GRAVITY, URINE: 1.02 (ref 1.001–1.035)
Yeast: NONE SEEN [HPF]

## 2015-06-13 MED ORDER — ESTRADIOL 0.1 MG/GM VA CREA: 2.0000 g | TOPICAL_CREAM | VAGINAL | Status: DC

## 2015-06-13 NOTE — Telephone Encounter (Signed)
SPOKE WITH PATIENT SHE HAD WENT TODAY TO SEE GYN DOCTOR HE STATES THAT SHE DID STILL HAVE BLOOD IN URINE AND TO FOLLOW UP IN 1 WEEK

## 2015-06-13 NOTE — Progress Notes (Signed)
Deborah Jones 08/29/1951 [id number]   History:    64 y.o.  for annual gyn exam  With no complaints today. On September 2 she was seen at the urgent care and was treated for UTI and appeared that she had passed a kidney stone. She is having no dysuria or frequency. Patient reports no back pain, fever, chills, nausea, or vomiting.Review of her record indicated that she had endoscopy approximately 5 years ago and erosive gastritis and gastric polyp had been removed. Patient had informed me that prior to that she was treated for H. Pylori. Her last colonoscopy was normal in 2010. Patient denies any hematochezia. Patient's PCP is Dr. Jarold Motto who is treating the patient for hyperlipidemia for which she's currently on pravastatin 20 mg daily. He has been doing her lab work. Patient's last bone density study was in 2014 with the lowest T score at the left femoral neck with a value of -1.5 normal Frax analysis and no statistically significant change in bone mineral density when compared with 2 years prior. Patient is taking vitamin D 2000 units daily.Patient stated that about 20 years ago she had atypical squamous cells of undetermined significance on her Pap smear with followup Pap smears being normal.Patient with past history of total abdominal hysterectomy with bilateral salpingectomies and right salpingo-oophorectomy for endometriosis. Patient is using Estrace vaginal cream twice a week for vaginal atrophy.  Past medical history,surgical history, family history and social history were all reviewed and documented in the EPIC chart.  Gynecologic History No LMP recorded. Patient has had a hysterectomy. Contraception: post menopausal status Last Pap: 2013. Results were: normal Last mammogram: 2016. Results were: normal  Obstetric History OB History  Gravida Para Term Preterm AB SAB TAB Ectopic Multiple Living  0                  ROS: A ROS was performed and pertinent positives and negatives are  included in the history.  GENERAL: No fevers or chills. HEENT: No change in vision, no earache, sore throat or sinus congestion. NECK: No pain or stiffness. CARDIOVASCULAR: No chest pain or pressure. No palpitations. PULMONARY: No shortness of breath, cough or wheeze. GASTROINTESTINAL: No abdominal pain, nausea, vomiting or diarrhea, melena or bright red blood per rectum. GENITOURINARY: No urinary frequency, urgency, hesitancy or dysuria. MUSCULOSKELETAL: No joint or muscle pain, no back pain, no recent trauma. DERMATOLOGIC: No rash, no itching, no lesions. ENDOCRINE: No polyuria, polydipsia, no heat or cold intolerance. No recent change in weight. HEMATOLOGICAL: No anemia or easy bruising or bleeding. NEUROLOGIC: No headache, seizures, numbness, tingling or weakness. PSYCHIATRIC: No depression, no loss of interest in normal activity or change in sleep pattern.     Exam: chaperone present  BP 124/80 mmHg  Ht 5\' 7"  (1.702 m)  Wt 127 lb (57.607 kg)  BMI 19.89 kg/m2  Body mass index is 19.89 kg/(m^2).  General appearance : Well developed well nourished female. No acute distress HEENT: Eyes: no retinal hemorrhage or exudates,  Neck supple, trachea midline, no carotid bruits, no thyroidmegaly Lungs: Clear to auscultation, no rhonchi or wheezes, or rib retractions  Heart: Regular rate and rhythm, no murmurs or gallops Breast:Examined in sitting and supine position were symmetrical in appearance, no palpable masses or tenderness,  no skin retraction, no nipple inversion, no nipple discharge, no skin discoloration, no axillary or supraclavicular lymphadenopathy Abdomen: no palpable masses or tenderness, no rebound or guarding Extremities: no edema or skin discoloration or tenderness  Pelvic:  Bartholin, Urethra, Skene Glands: Within normal limits             Vagina: No gross lesions or discharge  Cervix: Absent  Uterus  absent  Adnexa  Without masses or tenderness  Anus and perineum  normal    Rectovaginal  normal sphincter tone without palpated masses or tenderness             Hemoccult PCP provides   Urinalysis: few bacteria, 3-10 RBC, 0-5 WBC   Assessment/Plan:  64 y.o. female for annual exam doing well on estrogen vaginal cream twice a week. Pap smear was done today. PCP has been doing her blood work. Patient to schedule her bone density study for November this year. We discussed importance of calcium vitamin D and regular exercise for osteoporosis prevention. We also discussed importance of monthly breast exam. Patient recovering from having passed a kidney stone trace blood in her urine probably not unusual. I will have the patient return back to the office next week for urinalysis.   Ok Edwards MD, 8:29 AM 06/13/2015

## 2015-06-13 NOTE — Patient Instructions (Signed)

## 2015-06-13 NOTE — Addendum Note (Signed)
Addended by: Dayna Barker on: 06/13/2015 09:12 AM   Modules accepted: Orders

## 2015-06-14 LAB — STONE ANALYSIS: STONE WEIGHT KSTONE: 0.003 g

## 2015-06-14 LAB — URINE CULTURE
Colony Count: NO GROWTH
Organism ID, Bacteria: NO GROWTH

## 2015-06-14 LAB — CYTOLOGY - PAP

## 2015-06-20 ENCOUNTER — Other Ambulatory Visit: Payer: 59

## 2015-06-20 DIAGNOSIS — N3001 Acute cystitis with hematuria: Secondary | ICD-10-CM

## 2015-06-21 LAB — URINALYSIS W MICROSCOPIC + REFLEX CULTURE
Bacteria, UA: NONE SEEN [HPF]
Bilirubin Urine: NEGATIVE
CASTS: NONE SEEN [LPF]
Crystals: NONE SEEN [HPF]
GLUCOSE, UA: NEGATIVE
Hgb urine dipstick: NEGATIVE
Ketones, ur: NEGATIVE
NITRITE: NEGATIVE
PH: 6 (ref 5.0–8.0)
Protein, ur: NEGATIVE
SPECIFIC GRAVITY, URINE: 1.014 (ref 1.001–1.035)
YEAST: NONE SEEN [HPF]

## 2015-06-22 LAB — URINE CULTURE
COLONY COUNT: NO GROWTH
Organism ID, Bacteria: NO GROWTH

## 2015-08-11 ENCOUNTER — Other Ambulatory Visit: Payer: Self-pay | Admitting: Gynecology

## 2015-08-11 ENCOUNTER — Ambulatory Visit (INDEPENDENT_AMBULATORY_CARE_PROVIDER_SITE_OTHER): Payer: 59

## 2015-08-11 ENCOUNTER — Encounter: Payer: Self-pay | Admitting: Gynecology

## 2015-08-11 DIAGNOSIS — M8589 Other specified disorders of bone density and structure, multiple sites: Secondary | ICD-10-CM

## 2015-08-11 DIAGNOSIS — M899 Disorder of bone, unspecified: Secondary | ICD-10-CM | POA: Diagnosis not present

## 2015-08-11 DIAGNOSIS — Z13820 Encounter for screening for osteoporosis: Secondary | ICD-10-CM

## 2015-08-11 DIAGNOSIS — M8580 Other specified disorders of bone density and structure, unspecified site: Secondary | ICD-10-CM

## 2015-08-17 ENCOUNTER — Other Ambulatory Visit: Payer: Self-pay | Admitting: *Deleted

## 2015-08-17 DIAGNOSIS — M8580 Other specified disorders of bone density and structure, unspecified site: Secondary | ICD-10-CM

## 2015-10-14 MED FILL — PRAVASTATIN SODIUM 20 MG TA: 20 | 30 days supply | Qty: 30 | Fill #0

## 2015-10-14 MED FILL — SYNTHROID 50 MCG TABLET: 50 | 30 days supply | Qty: 30 | Fill #0

## 2015-11-16 MED FILL — PRAVASTATIN SODIUM 20 MG TA: 20 | 30 days supply | Qty: 30 | Fill #1

## 2015-11-16 MED FILL — SYNTHROID 50 MCG TABLET: 50 | 30 days supply | Qty: 30 | Fill #1

## 2015-12-19 MED FILL — SYNTHROID 50 MCG TABLET: 50 | 30 days supply | Qty: 30 | Fill #2

## 2015-12-19 MED FILL — PRAVASTATIN SODIUM 20 MG TA: 20 | 30 days supply | Qty: 30 | Fill #2

## 2016-01-17 MED FILL — PRAVASTATIN SODIUM 20 MG TA: 20 | 30 days supply | Qty: 30 | Fill #3

## 2016-01-17 MED FILL — SYNTHROID 50 MCG TABLET: 50 | 30 days supply | Qty: 30 | Fill #3

## 2016-02-14 MED FILL — SYNTHROID 50 MCG TABLET: 50 | 30 days supply | Qty: 30 | Fill #4

## 2016-02-14 MED FILL — PRAVASTATIN SODIUM 20 MG TA: 20 | 30 days supply | Qty: 30 | Fill #4

## 2016-02-14 MED FILL — metroNIDAZOLE 0.75 % CREA: 0.75 | 30 days supply | Qty: 45 | Fill #1

## 2016-02-17 ENCOUNTER — Other Ambulatory Visit: Payer: Self-pay

## 2016-02-17 DIAGNOSIS — Z1231 Encounter for screening mammogram for malignant neoplasm of breast: Secondary | ICD-10-CM

## 2016-02-22 ENCOUNTER — Ambulatory Visit
Admission: RE | Admit: 2016-02-22 | Discharge: 2016-02-22 | Disposition: A | Discharge status: Home / Self Care | Payer: 59 | Source: Ambulatory Visit

## 2016-02-22 DIAGNOSIS — Z1231 Encounter for screening mammogram for malignant neoplasm of breast: Secondary | ICD-10-CM

## 2016-03-22 MED FILL — SYNTHROID 50 MCG TABLET: 50 | 30 days supply | Qty: 30 | Fill #5

## 2016-03-22 MED FILL — PRAVASTATIN SODIUM 20 MG TA: 20 | 30 days supply | Qty: 30 | Fill #5

## 2016-04-18 MED FILL — PRAVASTATIN SODIUM 20 MG TA: 20 | 30 days supply | Qty: 30 | Fill #6

## 2016-04-18 MED FILL — metroNIDAZOLE 0.75 % CREA: 0.75 | 30 days supply | Qty: 45 | Fill #2

## 2016-04-18 MED FILL — SYNTHROID 50 MCG TABLET: 50 | 30 days supply | Qty: 30 | Fill #6

## 2016-05-17 MED FILL — PRAVASTATIN SODIUM 20 MG TA: 20 | 30 days supply | Qty: 30 | Fill #7

## 2016-05-17 MED FILL — SYNTHROID 50 MCG TABLET: 50 | 30 days supply | Qty: 30 | Fill #7

## 2016-06-14 ENCOUNTER — Encounter: Payer: 59 | Admitting: Gynecology

## 2016-06-20 MED FILL — PRAVASTATIN SODIUM 20 MG TA: 20 | 30 days supply | Qty: 30 | Fill #8

## 2016-06-20 MED FILL — SYNTHROID 50 MCG TABLET: 50 | 30 days supply | Qty: 30 | Fill #8

## 2016-07-05 ENCOUNTER — Encounter: Payer: Self-pay | Admitting: Gynecology

## 2016-07-05 ENCOUNTER — Ambulatory Visit (INDEPENDENT_AMBULATORY_CARE_PROVIDER_SITE_OTHER): Payer: 59 | Admitting: Gynecology

## 2016-07-05 VITALS — BP 118/76 | Ht 66.5 in | Wt 126.6 lb

## 2016-07-05 DIAGNOSIS — R3129 Other microscopic hematuria: Secondary | ICD-10-CM | POA: Diagnosis not present

## 2016-07-05 DIAGNOSIS — M8580 Other specified disorders of bone density and structure, unspecified site: Secondary | ICD-10-CM | POA: Diagnosis not present

## 2016-07-05 DIAGNOSIS — Z01419 Encounter for gynecological examination (general) (routine) without abnormal findings: Secondary | ICD-10-CM | POA: Diagnosis not present

## 2016-07-05 DIAGNOSIS — N952 Postmenopausal atrophic vaginitis: Secondary | ICD-10-CM | POA: Diagnosis not present

## 2016-07-05 DIAGNOSIS — Z23 Encounter for immunization: Secondary | ICD-10-CM

## 2016-07-05 LAB — URINALYSIS W MICROSCOPIC + REFLEX CULTURE
BILIRUBIN URINE: NEGATIVE
Casts: NONE SEEN [LPF]
Crystals: NONE SEEN [HPF]
GLUCOSE, UA: NEGATIVE
Ketones, ur: NEGATIVE
Nitrite: NEGATIVE
PH: 6 (ref 5.0–8.0)
PROTEIN: NEGATIVE
SPECIFIC GRAVITY, URINE: 1.02 (ref 1.001–1.035)
Yeast: NONE SEEN [HPF]

## 2016-07-05 MED ORDER — ESTRADIOL 0.1 MG/GM VA CREA: 2.0000 g | TOPICAL_CREAM | VAGINAL | 8 refills | Status: DC

## 2016-07-05 MED FILL — ESTRACE 0.01% CREAM: 0.1 | 31 days supply | Qty: 43 | Fill #0

## 2016-07-05 NOTE — Patient Instructions (Signed)

## 2016-07-05 NOTE — Progress Notes (Addendum)
Deborah Jones 09-10-1951 [id number]   History:    65 y.o.  for annual gyn exam with no complaints today. Patient is doing well on her vaginal estrogen twice a week for vaginal atrophy. Her PCP is Dr. Jarold Jones who is been doing her blood work. He has been watching her closely because of microscopic hematuria is in the process of being referred to the urologist for further evaluation. She did pass a kidney stone last year. Patient had a normal colonoscopy in 2010 she also has been treated in the past for H. pylori. Her PCP is treating her for hyperlipidemia. Patient's last bone density study was in 2016 the lowest T score was at the left femoral neck with a value of -1.6. And negative Frax analysis. Patient 20 years ago had atypical squamous cells of undetermined significance and her Pap smear and since then her patches of been normal. Patient also with past history of total abdominal hysterectomy with bilateral salpingectomies and right salpingo-oophorectomy for endometriosis.   Past medical history,surgical history, family history and social history were all reviewed and documented in the EPIC chart.  Gynecologic History No LMP recorded. Patient has had a hysterectomy. Contraception: status post hysterectomy Last Pap: 2012, 2013, 2016. Results were: normal Last mammogram: 2017. Results were: normal  Obstetric History OB History  Gravida Para Term Preterm AB Living  0            SAB TAB Ectopic Multiple Live Births                    ROS: A ROS was performed and pertinent positives and negatives are included in the history.  GENERAL: No fevers or chills. HEENT: No change in vision, no earache, sore throat or sinus congestion. NECK: No pain or stiffness. CARDIOVASCULAR: No chest pain or pressure. No palpitations. PULMONARY: No shortness of breath, cough or wheeze. GASTROINTESTINAL: No abdominal pain, nausea, vomiting or diarrhea, melena or bright red blood per rectum. GENITOURINARY:  No urinary frequency, urgency, hesitancy or dysuria. MUSCULOSKELETAL: No joint or muscle pain, no back pain, no recent trauma. DERMATOLOGIC: No rash, no itching, no lesions. ENDOCRINE: No polyuria, polydipsia, no heat or cold intolerance. No recent change in weight. HEMATOLOGICAL: No anemia or easy bruising or bleeding. NEUROLOGIC: No headache, seizures, numbness, tingling or weakness. PSYCHIATRIC: No depression, no loss of interest in normal activity or change in sleep pattern.     Exam: chaperone present  BP 118/76   Ht 5' 6.5" (1.689 m)   Wt 126 lb 9.6 oz (57.4 kg)   BMI 20.13 kg/m   Body mass index is 20.13 kg/m.  General appearance : Well developed well nourished female. No acute distress HEENT: Eyes: no retinal hemorrhage or exudates,  Neck supple, trachea midline, no carotid bruits, no thyroidmegaly Lungs: Clear to auscultation, no rhonchi or wheezes, or rib retractions  Heart: Regular rate and rhythm, no murmurs or gallops Breast:Examined in sitting and supine position were symmetrical in appearance, no palpable masses or tenderness,  no skin retraction, no nipple inversion, no nipple discharge, no skin discoloration, no axillary or supraclavicular lymphadenopathy Abdomen: no palpable masses or tenderness, no rebound or guarding Extremities: no edema or skin discoloration or tenderness  Pelvic:  Bartholin, Urethra, Skene Glands: Within normal limits             Vagina: No gross lesions or discharge  Cervix: Absent  Uterus  absent Adnexa  Without masses or tenderness  Anus and perineum  normal   Rectovaginal  normal sphincter tone without palpated masses or tenderness             Hemoccult PCP provides     Assessment/Plan:  65 y.o. female for annual exam will receive her flu vaccine today after being counseled. Her PCP is doing her blood work. Pap smear not indicated. Patient will follow-up with her urologist for her microscopic hematuria history we will check her urinalysis  today. We discussed importance of calcium vitamin D and weightbearing exercises for osteoporosis prevention. Prescription refill for vaginal estrogen twice a week provided.  Her urinalysis today demonstrated moderate bacteria, 3-10 RBC and 6-10 WBC culture pending  Deborah Jones,[name] MD, 9:17 AM 07/05/2016

## 2016-07-06 DIAGNOSIS — Z23 Encounter for immunization: Secondary | ICD-10-CM | POA: Diagnosis not present

## 2016-07-06 NOTE — Addendum Note (Signed)
Addended by: Berna SpareASTILLO, Billey Wojciak A on: 07/06/2016 09:07 AM   Modules accepted: Orders

## 2016-07-07 LAB — URINE CULTURE

## 2016-07-09 ENCOUNTER — Other Ambulatory Visit: Payer: Self-pay | Admitting: Gynecology

## 2016-07-09 DIAGNOSIS — N3001 Acute cystitis with hematuria: Secondary | ICD-10-CM

## 2016-07-09 MED ORDER — NITROFURANTOIN MONOHYD MACRO 100 MG PO CAPS: 100.0000 mg | ORAL_CAPSULE | Freq: Two times a day (BID) | ORAL | 0 refills | Status: DC

## 2016-07-09 MED FILL — NITROFURANTOIN MONO-MCR 100: 100 | 7 days supply | Qty: 14 | Fill #0

## 2016-07-20 MED FILL — SYNTHROID 50 MCG TABLET: 50 | 30 days supply | Qty: 30 | Fill #0

## 2016-07-20 MED FILL — PRAVASTATIN SODIUM 20 MG TA: 20 | 30 days supply | Qty: 30 | Fill #0

## 2016-07-23 ENCOUNTER — Other Ambulatory Visit: Payer: 59

## 2016-07-23 DIAGNOSIS — N3001 Acute cystitis with hematuria: Secondary | ICD-10-CM

## 2016-07-24 LAB — URINALYSIS W MICROSCOPIC + REFLEX CULTURE
BILIRUBIN URINE: NEGATIVE
Bacteria, UA: NONE SEEN [HPF]
CRYSTALS: NONE SEEN [HPF]
Casts: NONE SEEN [LPF]
GLUCOSE, UA: NEGATIVE
Hgb urine dipstick: NEGATIVE
KETONES UR: NEGATIVE
LEUKOCYTES UA: NEGATIVE
Nitrite: NEGATIVE
PH: 6 (ref 5.0–8.0)
Protein, ur: NEGATIVE
RBC / HPF: NONE SEEN RBC/HPF (ref <=2)
SPECIFIC GRAVITY, URINE: 1.012 (ref 1.001–1.035)
Squamous Epithelial / LPF: NONE SEEN [HPF] (ref <=5)
WBC UA: NONE SEEN WBC/HPF (ref <=5)
Yeast: NONE SEEN [HPF]

## 2016-07-25 ENCOUNTER — Encounter: Payer: Self-pay | Admitting: Women's Health

## 2016-08-14 MED FILL — SYNTHROID 50 MCG TABLET: 50 | 30 days supply | Qty: 30 | Fill #1

## 2016-09-07 MED FILL — SYNTHROID 50 MCG TABLET: 50 | 30 days supply | Qty: 30 | Fill #2

## 2016-09-07 MED FILL — PRAVASTATIN SODIUM 20 MG TA: 20 | 30 days supply | Qty: 30 | Fill #1

## 2016-10-12 MED FILL — SYNTHROID 50 MCG TABLET: 50 | 30 days supply | Qty: 30 | Fill #3

## 2016-10-12 MED FILL — PRAVASTATIN SODIUM 20 MG TA: 20 | 30 days supply | Qty: 30 | Fill #2

## 2016-10-21 ENCOUNTER — Ambulatory Visit (HOSPITAL_COMMUNITY)
Admission: EM | Admit: 2016-10-21 | Discharge: 2016-10-21 | Disposition: A | Discharge status: Home / Self Care | Payer: 59 | Attending: Emergency Medicine | Admitting: Emergency Medicine

## 2016-10-21 ENCOUNTER — Encounter (HOSPITAL_COMMUNITY): Payer: Self-pay | Admitting: *Deleted

## 2016-10-21 DIAGNOSIS — J00 Acute nasopharyngitis [common cold]: Secondary | ICD-10-CM

## 2016-10-21 NOTE — Discharge Instructions (Signed)
Your symptoms are most likely due to the common cold virus.  You may take over the counter cough/cold medications such as Robitussin, Delsym, or Mucinex to help with cough and congestion. °You may continue to use Afrin for a total of 3 consecutive days to help with nasal congestion.  Sinuses rinses such as a Netti Pot or Neil Med nasal saline kit are also good for nasal congestion. ° °The common cold, while not as severe as the influenza virus, is still contagious.  It is recommended you wash hands frequently and cover your mouth and nose with your elbow/sleeve while coughing or sneezing.   °

## 2016-10-21 NOTE — ED Triage Notes (Signed)
PT  REPORTS    SYMPTOMS  OF   SORETHROAT   HEADACHE  RUNNY  NOSE       STARTED  YESTERDAY        TYLENOL

## 2016-10-21 NOTE — ED Provider Notes (Signed)
CSN: [id number]     Arrival date & time 10/21/16  1234 History   First MD Initiated Contact with Patient 10/21/16 1517     Chief Complaint  Patient presents with  . URI   (Consider location/radiation/quality/duration/timing/severity/associated sxs/prior Treatment) HPI  Deborah Jones is a 66 y.o. female presenting to UC with c/o nasal congestion and sore throat that started yesterday.  Sore throat has improved significantly since yesterday, however, pt notes she is leaving tomorrow to fly down to help care for her 31 year old parents and wanted to make sure she does not have the flu.  She did use Afrin last night with moderate relief of sinus congestion.  Denies fever, chills, n/v/d. She did get the flu vaccine in Oct. 2017.    Past Medical History:  Diagnosis Date  . Endometriosis   . GERD (gastroesophageal reflux disease)   . Hypothyroidism   . Kidney stone   . Low back pain    STARTED IN JULY 2012  . Osteopenia   . Rosacea    Past Surgical History:  Procedure Laterality Date  . ABDOMINAL HYSTERECTOMY     TAH/ WITH BILATERAL SALPINGECTOMIES AND  RIGHT OOPHORECTOMY   . OOPHORECTOMY     RSO  . US ECHOCARDIOGRAPHY  04/01/2009   EF 55-60%   Family History  Problem Relation Age of Onset  . Hypertension Mother   . Asthma Mother   . Atrial fibrillation Father   . Cancer Maternal Grandmother     COLON  . Cancer Maternal Grandfather     BRAIN CANCER   Social History  Substance Use Topics  . Smoking status: Never Smoker  . Smokeless tobacco: Never Used  . Alcohol use No   OB History    Gravida Para Term Preterm AB Living   0             SAB TAB Ectopic Multiple Live Births                 Review of Systems  Constitutional: Negative for chills and fever.  HENT: Positive for congestion, postnasal drip, rhinorrhea and sore throat. Negative for ear pain, sinus pain, sinus pressure, trouble swallowing and voice change.   Respiratory: Positive for cough ( minimal). Negative  for shortness of breath.   Cardiovascular: Negative for chest pain and palpitations.  Gastrointestinal: Negative for abdominal pain, diarrhea, nausea and vomiting.  Musculoskeletal: Negative for arthralgias, back pain and myalgias.  Skin: Negative for rash.  Neurological: Positive for headaches ( mild generalized). Negative for dizziness and light-headedness.    Allergies  Patient has no known allergies.  Home Medications   Prior to Admission medications   Medication Sig Start Date End Date Taking? Authorizing Provider  Cholecalciferol (VITAMIN D3) 2000 UNITS TABS Take by mouth daily.    Historical Provider, MD  estradiol (ESTRACE) 0.1 MG/GM vaginal cream Place 2 g vaginally 2 (two) times a week. 07/05/16   Ok Edwards, MD  levothyroxine (SYNTHROID, LEVOTHROID) 50 MCG tablet Take 50 mcg by mouth daily.    Historical Provider, MD  metroNIDAZOLE (METROCREAM) 0.75 % cream Apply topically 2 (two) times daily.    Historical Provider, MD  Multiple Vitamin (MULTIVITAMIN) tablet Take 1 tablet by mouth daily.    Historical Provider, MD  Multiple Vitamins-Minerals (MULTIVITAMIN WITH IRON-MINERALS) liquid Take by mouth daily.    Historical Provider, MD  nitrofurantoin, macrocrystal-monohydrate, (MACROBID) 100 MG capsule Take 1 capsule (100 mg total) by mouth 2 (two) times daily.  07/09/16   Harrington ChallengerNancy J Young, NP  Omega-3 Fatty Acids (FISH OIL PO) Take by mouth.    Historical Provider, MD  Polyethyl Glyc-Propyl Glyc PF (SYSTANE ULTRA PF) 0.4-0.3 % SOLN Apply to eye.    Historical Provider, MD  pravastatin (PRAVACHOL) 20 MG tablet Take 20 mg by mouth daily.    Historical Provider, MD   Meds Ordered and Administered this Visit  Medications - No data to display  BP 127/78 (BP Location: Right Arm)   Pulse 70   Temp 98.1 F (36.7 C) (Oral)   Resp 18   SpO2 100%  No data found.   Physical Exam  Constitutional: She is oriented to person, place, and time. She appears well-developed and  well-nourished. No distress.  HENT:  Head: Normocephalic and atraumatic.  Right Ear: Tympanic membrane normal.  Left Ear: Tympanic membrane normal.  Nose: Mucosal edema present. Right sinus exhibits no maxillary sinus tenderness and no frontal sinus tenderness. Left sinus exhibits no maxillary sinus tenderness and no frontal sinus tenderness.  Mouth/Throat: Uvula is midline and mucous membranes are normal. Posterior oropharyngeal erythema present. No oropharyngeal exudate, posterior oropharyngeal edema or tonsillar abscesses.  Eyes: EOM are normal.  Neck: Normal range of motion. Neck supple.  Cardiovascular: Normal rate and regular rhythm.   Pulmonary/Chest: Effort normal and breath sounds normal. No stridor. No respiratory distress. She has no wheezes. She has no rales.  Musculoskeletal: Normal range of motion.  Lymphadenopathy:    She has no cervical adenopathy.  Neurological: She is alert and oriented to person, place, and time.  Skin: Skin is warm and dry. She is not diaphoretic.  Psychiatric: She has a normal mood and affect. Her behavior is normal.  Nursing note and vitals reviewed.   Urgent Care Course     Procedures (including critical care time)  Labs Review Labs Reviewed - No data to display  Imaging Review No results found.    MDM   1. Common cold virus    Pt c/o mild URI symptoms since yesterday. Pt appears well, NAD. Afebrile. Denies body aches or nausea.   Symptoms likely due to common cold virus. Explained to pt this virus, while milder symptoms than influenza, is still contagious. Discussed ways to help prevent spread of illness. Patient verbalized understanding and agreement with treatment plan.    Junius Finnerrin O'Malley, PA-C 10/21/16 1546

## 2016-11-13 MED FILL — PRAVASTATIN SODIUM 20 MG TA: 20 | 30 days supply | Qty: 30 | Fill #3

## 2016-11-13 MED FILL — metroNIDAZOLE 0.75 % CREA: 0.75 | 30 days supply | Qty: 45 | Fill #0

## 2016-11-13 MED FILL — SYNTHROID 50 MCG TABLET: 50 | 30 days supply | Qty: 30 | Fill #4

## 2016-12-17 MED FILL — SYNTHROID 50 MCG TABLET: 50 | 30 days supply | Qty: 30 | Fill #5

## 2016-12-17 MED FILL — PRAVASTATIN SODIUM 20 MG TA: 20 | 30 days supply | Qty: 30 | Fill #4

## 2017-01-14 MED FILL — PRAVASTATIN SODIUM 20 MG TA: 20 | 30 days supply | Qty: 30 | Fill #5

## 2017-01-14 MED FILL — SYNTHROID 50 MCG TABLET: 50 | 30 days supply | Qty: 30 | Fill #6

## 2017-02-04 ENCOUNTER — Other Ambulatory Visit: Payer: Self-pay | Admitting: Gynecology

## 2017-02-04 DIAGNOSIS — Z1231 Encounter for screening mammogram for malignant neoplasm of breast: Secondary | ICD-10-CM

## 2017-02-11 MED FILL — SYNTHROID 50 MCG TABLET: 50 | 30 days supply | Qty: 30 | Fill #7

## 2017-02-11 MED FILL — PRAVASTATIN SODIUM 20 MG TA: 20 | 30 days supply | Qty: 30 | Fill #6

## 2017-02-13 ENCOUNTER — Encounter: Payer: Self-pay | Admitting: Gynecology

## 2017-02-28 ENCOUNTER — Ambulatory Visit: Payer: 59

## 2017-03-12 ENCOUNTER — Other Ambulatory Visit: Payer: Self-pay | Admitting: Obstetrics & Gynecology

## 2017-03-12 ENCOUNTER — Ambulatory Visit
Admission: RE | Admit: 2017-03-12 | Discharge: 2017-03-12 | Disposition: A | Discharge status: Home / Self Care | Payer: 59 | Source: Ambulatory Visit | Attending: Gynecology | Admitting: Gynecology

## 2017-03-12 DIAGNOSIS — Z1231 Encounter for screening mammogram for malignant neoplasm of breast: Secondary | ICD-10-CM

## 2017-03-15 MED FILL — PRAVASTATIN SODIUM 20 MG TA: 20 | 30 days supply | Qty: 30 | Fill #7

## 2017-03-15 MED FILL — SYNTHROID 50 MCG TABLET: 50 | 30 days supply | Qty: 30 | Fill #8

## 2017-04-18 MED FILL — PRAVASTATIN SODIUM 20 MG TA: 20 | 30 days supply | Qty: 30 | Fill #8

## 2017-04-18 MED FILL — metroNIDAZOLE 0.75 % CREA: 0.75 | 30 days supply | Qty: 45 | Fill #1

## 2017-04-18 MED FILL — SYNTHROID 50 MCG TABLET: 50 | 31 days supply | Qty: 31 | Fill #0

## 2017-05-17 MED FILL — SYNTHROID 50 MCG TABLET: 50 | 31 days supply | Qty: 31 | Fill #1

## 2017-05-17 MED FILL — PRAVASTATIN SODIUM 20 MG TA: 20 | 31 days supply | Qty: 31 | Fill #0

## 2017-06-11 MED FILL — SYNTHROID 50 MCG TABLET: 50 | 31 days supply | Qty: 31 | Fill #2

## 2017-07-08 ENCOUNTER — Ambulatory Visit (INDEPENDENT_AMBULATORY_CARE_PROVIDER_SITE_OTHER): Payer: BLUE CROSS/BLUE SHIELD | Admitting: Obstetrics & Gynecology

## 2017-07-08 ENCOUNTER — Encounter: Payer: 59 | Admitting: Gynecology

## 2017-07-08 ENCOUNTER — Encounter: Payer: Self-pay | Admitting: Obstetrics & Gynecology

## 2017-07-08 VITALS — BP 120/76 | Ht 67.0 in | Wt 126.0 lb

## 2017-07-08 DIAGNOSIS — Z90710 Acquired absence of both cervix and uterus: Secondary | ICD-10-CM | POA: Diagnosis not present

## 2017-07-08 DIAGNOSIS — Z780 Asymptomatic menopausal state: Secondary | ICD-10-CM | POA: Diagnosis not present

## 2017-07-08 DIAGNOSIS — Z23 Encounter for immunization: Secondary | ICD-10-CM

## 2017-07-08 DIAGNOSIS — N952 Postmenopausal atrophic vaginitis: Secondary | ICD-10-CM | POA: Diagnosis not present

## 2017-07-08 DIAGNOSIS — Z01411 Encounter for gynecological examination (general) (routine) with abnormal findings: Secondary | ICD-10-CM | POA: Diagnosis not present

## 2017-07-08 NOTE — Progress Notes (Signed)
Deborah Jones 1950/10/26 [id number]   History:    66 y.o.  G0 Married  RP:  Established patient presenting for annual gyn exam   HPI:  Would like to be sexually active with husband again.  Menopause.  No HRT.  S/P Hysterectomy.  No pelvic pain.  Breasts wnl.  Mictions/BMs wnl.  Past medical history,surgical history, family history and social history were all reviewed and documented in the EPIC chart.  Gynecologic History No LMP recorded. Patient has had a hysterectomy. Contraception: status post hysterectomy Last Pap: 2016. Results were: normal Last mammogram: 2018. Results were: normal Bone density:  Osteopenia 2016 Colono 2015  Obstetric History OB History  Gravida Para Term Preterm AB Living  0            SAB TAB Ectopic Multiple Live Births                    ROS: A ROS was performed and pertinent positives and negatives are included in the history.  GENERAL: No fevers or chills. HEENT: No change in vision, no earache, sore throat or sinus congestion. NECK: No pain or stiffness. CARDIOVASCULAR: No chest pain or pressure. No palpitations. PULMONARY: No shortness of breath, cough or wheeze. GASTROINTESTINAL: No abdominal pain, nausea, vomiting or diarrhea, melena or bright red blood per rectum. GENITOURINARY: No urinary frequency, urgency, hesitancy or dysuria. MUSCULOSKELETAL: No joint or muscle pain, no back pain, no recent trauma. DERMATOLOGIC: No rash, no itching, no lesions. ENDOCRINE: No polyuria, polydipsia, no heat or cold intolerance. No recent change in weight. HEMATOLOGICAL: No anemia or easy bruising or bleeding. NEUROLOGIC: No headache, seizures, numbness, tingling or weakness. PSYCHIATRIC: No depression, no loss of interest in normal activity or change in sleep pattern.     Exam:   BP 120/76   Ht  (1.702 m)   Wt 126 lb (57.2 kg)   BMI 19.73 kg/m   Body mass index is 19.73 kg/m.  General appearance : Well developed well nourished female. No  acute distress HEENT: Eyes: no retinal hemorrhage or exudates,  Neck supple, trachea midline, no carotid bruits, no thyroidmegaly Lungs: Clear to auscultation, no rhonchi or wheezes, or rib retractions  Heart: Regular rate and rhythm, no murmurs or gallops Breast:Examined in sitting and supine position were symmetrical in appearance, no palpable masses or tenderness,  no skin retraction, no nipple inversion, no nipple discharge, no skin discoloration, no axillary or supraclavicular lymphadenopathy Abdomen: no palpable masses or tenderness, no rebound or guarding Extremities: no edema or skin discoloration or tenderness  Pelvic: Vulva normal  Bartholin, Urethra, Skene Glands: Within normal limits             Vagina: No gross lesions or discharge.  Mild atrophic vaginitis.  Cervix/Uterus absent  Adnexa  Without masses or tenderness  Anus and perineum  normal     Assessment/Plan:  66 y.o. female for annual exam   1. Encounter for gynecological examination with abnormal finding Gyn exam s/p Hysterectomy.  Pap normal 2016.  Breasts wnl.  Mammo normal 2018.  Colono 2015.  2. Hx of total hysterectomy   3. Menopause present [place], [place] to resume sexual activity.  Prefers no Estrogen at this time.  If atrophic vaginitis of menopause worsens, will call back for a prescription of Estrace cream.  Replens moisturizer as needed. Vit. D supplements, Ca++ in food, weight bearing physical activity. - DG Bone Density; Future  Counseling on above issues >50% x 15 minutes.  Genia Del MD, 11:01 AM 07/08/2017

## 2017-07-13 NOTE — Patient Instructions (Signed)
1. Encounter for gynecological examination with abnormal finding Gyn exam s/p Hysterectomy.  Pap normal 2016.  Breasts wnl.  Mammo normal 2018.  Colono 2015.  2. Hx of total hysterectomy   3. Menopause present [place], [place] to resume sexual activity.  Prefers no Estrogen at this time.  If atrophic vaginitis of menopause worsens, will call back for a prescription of Estrace cream.  Replens moisturizer as needed. Vit. D supplements, Ca++ in food, weight bearing physical activity. - DG Bone Density; Future  Deborah Jones, it was a pleasure meeting you today!    Atrophic Vaginitis Atrophic vaginitis is a condition in which the tissues that line the vagina become dry and thin. This condition is most common in women who have stopped having regular menstrual periods (menopause). This usually starts when a woman is 89-65 years old. Estrogen helps to keep the vagina moist. It stimulates the vagina to produce a clear fluid that lubricates the vagina for sexual intercourse. This fluid also protects the vagina from infection. Lack of estrogen can cause the lining of the vagina to get thinner and dryer. The vagina may also shrink in size. It may become less elastic. Atrophic vaginitis tends to get worse over time as a woman's estrogen level drops. What are the causes? This condition is caused by the normal drop in estrogen that happens around the time of menopause. What increases the risk? Certain conditions or situations may lower a woman's estrogen level, which increases her risk of atrophic vaginitis. These include:  Taking medicine that blocks estrogen.  Having ovaries removed surgically.  Being treated for cancer with X-ray treatment (radiation) or medicines (chemotherapy).  Exercising very hard and often.  Having an eating disorder (anorexia).  Giving birth or breastfeeding.  Being over the age of 65.  Smoking.  What are the signs or symptoms? Symptoms of this condition include:  Pain,  soreness, or bleeding during sexual intercourse (dyspareunia).  Vaginal burning, irritation, or itching.  Pain or bleeding during a vaginal examination using a speculum (pelvic exam).  Loss of interest in sexual activity.  Having burning pain when passing urine.  Vaginal discharge that is brown or yellow.  In some cases, there are no symptoms. How is this diagnosed? This condition is diagnosed with a medical history and physical exam. This will include a pelvic exam that checks whether the inside of your vagina appears pale, thin, or dry. Rarely, you may also have other tests, including:  A urine test.  A test that checks the acid balance in your vaginal fluid (acid balance test).  How is this treated? Treatment for this condition may depend on the severity of your symptoms. Treatment may include:  Using an over-the-counter vaginal lubricant before you have sexual intercourse.  Using a long-acting vaginal moisturizer.  Using low-dose vaginal estrogen for moderate to severe symptoms that do not respond to other treatments. Options include creams, tablets, and inserts (vaginal rings). Before using vaginal estrogen, tell your health care provider if you have a history of: ? Breast cancer. ? Endometrial cancer. ? Blood clots.  Taking medicines. You may be able to take a daily pill for dyspareunia. Discuss all of the risks of this medicine with your health care provider. It is usually not recommended for women who have a family history or personal history of breast cancer.  If your symptoms are very mild and you are not sexually active, you may not need treatment. Follow these instructions at home:  Take medicines only as directed by  your health care provider. Do not use herbal or alternative medicines unless your health care provider says that you can.  Use over-the-counter creams, lubricants, or moisturizers for dryness only as directed by your health care provider.  If your  atrophic vaginitis is caused by menopause, discuss all of your menopausal symptoms and treatment options with your health care provider.  Do not douche.  Do not use products that can make your vagina dry. These include: ? Scented feminine sprays. ? Scented tampons. ? Scented soaps.  If it hurts to have sex, talk with your sexual partner. Contact a health care provider if:  Your discharge looks different than normal.  Your vagina has an unusual smell.  You have new symptoms.  Your symptoms do not improve with treatment.  Your symptoms get worse. This information is not intended to replace advice given to you by your health care provider. Make sure you discuss any questions you have with your health care provider. Document Released: 02/01/2015 Document Revised: 02/23/2016 Document Reviewed: 09/08/2014 Deborah Jones  Deborah Jones.

## 2017-07-17 MED FILL — SYNTHROID 50 MCG TABLET: 50 | 30 days supply | Qty: 30 | Fill #3

## 2017-07-17 MED FILL — PRAVASTATIN SODIUM 20 MG TA: 20 | 31 days supply | Qty: 31 | Fill #1

## 2017-08-15 MED FILL — SYNTHROID 50 MCG TABLET: 50 | 30 days supply | Qty: 30 | Fill #4

## 2017-08-15 MED FILL — PRAVASTATIN SODIUM 20 MG TA: 20 | 31 days supply | Qty: 31 | Fill #2

## 2017-08-26 ENCOUNTER — Other Ambulatory Visit: Payer: Self-pay | Admitting: Gynecology

## 2017-08-26 DIAGNOSIS — Z13820 Encounter for screening for osteoporosis: Secondary | ICD-10-CM

## 2017-08-31 DIAGNOSIS — M8580 Other specified disorders of bone density and structure, unspecified site: Secondary | ICD-10-CM

## 2017-08-31 HISTORY — DX: Other specified disorders of bone density and structure, unspecified site: M85.80

## 2017-09-03 DIAGNOSIS — R591 Generalized enlarged lymph nodes: Secondary | ICD-10-CM | POA: Diagnosis not present

## 2017-09-03 DIAGNOSIS — Z681 Body mass index (BMI) 19 or less, adult: Secondary | ICD-10-CM | POA: Diagnosis not present

## 2017-09-03 DIAGNOSIS — J029 Acute pharyngitis, unspecified: Secondary | ICD-10-CM | POA: Diagnosis not present

## 2017-09-03 MED FILL — AMOXICILLIN 875 MG TABLET: 875 | 7 days supply | Qty: 14 | Fill #0

## 2017-09-12 ENCOUNTER — Other Ambulatory Visit: Payer: Self-pay | Admitting: Gynecology

## 2017-09-12 ENCOUNTER — Encounter: Payer: Self-pay | Admitting: Gynecology

## 2017-09-12 ENCOUNTER — Ambulatory Visit (INDEPENDENT_AMBULATORY_CARE_PROVIDER_SITE_OTHER): Payer: PPO

## 2017-09-12 DIAGNOSIS — M8589 Other specified disorders of bone density and structure, multiple sites: Secondary | ICD-10-CM

## 2017-09-12 DIAGNOSIS — Z780 Asymptomatic menopausal state: Secondary | ICD-10-CM

## 2017-09-12 DIAGNOSIS — Z13820 Encounter for screening for osteoporosis: Secondary | ICD-10-CM

## 2017-09-16 MED FILL — PRAVASTATIN SODIUM 20 MG TA: 20 | 31 days supply | Qty: 31 | Fill #3

## 2017-09-16 MED FILL — SYNTHROID 50 MCG TABLET: 50 | 30 days supply | Qty: 30 | Fill #5

## 2017-09-16 MED FILL — metroNIDAZOLE 0.75 % CREA: 0.75 | 30 days supply | Qty: 45 | Fill #2

## 2017-09-17 DIAGNOSIS — R591 Generalized enlarged lymph nodes: Secondary | ICD-10-CM | POA: Diagnosis not present

## 2017-10-18 MED FILL — PRAVASTATIN SODIUM 20 MG TA: 20 | 31 days supply | Qty: 31 | Fill #4

## 2017-10-21 MED FILL — SYNTHROID 50 MCG TABLET: 50 | 30 days supply | Qty: 30 | Fill #6

## 2017-11-18 MED FILL — PRAVASTATIN SODIUM 20 MG TA: 20 | 31 days supply | Qty: 31 | Fill #5

## 2017-11-18 MED FILL — SYNTHROID 50 MCG TABLET: 50 | 30 days supply | Qty: 30 | Fill #7

## 2017-12-17 MED FILL — SYNTHROID 50 MCG TABLET: 50 | 90 days supply | Qty: 90 | Fill #0

## 2017-12-17 MED FILL — PRAVASTATIN SODIUM 20 MG TA: 20 | 31 days supply | Qty: 31 | Fill #6

## 2017-12-31 DIAGNOSIS — E038 Other specified hypothyroidism: Secondary | ICD-10-CM | POA: Diagnosis not present

## 2017-12-31 DIAGNOSIS — E559 Vitamin D deficiency, unspecified: Secondary | ICD-10-CM | POA: Diagnosis not present

## 2017-12-31 DIAGNOSIS — R82998 Other abnormal findings in urine: Secondary | ICD-10-CM | POA: Diagnosis not present

## 2017-12-31 DIAGNOSIS — E7849 Other hyperlipidemia: Secondary | ICD-10-CM | POA: Diagnosis not present

## 2018-01-07 DIAGNOSIS — E785 Hyperlipidemia, unspecified: Secondary | ICD-10-CM | POA: Diagnosis not present

## 2018-01-07 DIAGNOSIS — Z1389 Encounter for screening for other disorder: Secondary | ICD-10-CM | POA: Diagnosis not present

## 2018-01-07 DIAGNOSIS — I8390 Asymptomatic varicose veins of unspecified lower extremity: Secondary | ICD-10-CM | POA: Diagnosis not present

## 2018-01-07 DIAGNOSIS — E559 Vitamin D deficiency, unspecified: Secondary | ICD-10-CM | POA: Diagnosis not present

## 2018-01-07 DIAGNOSIS — Z0000 Encounter for general adult medical examination without abnormal findings: Secondary | ICD-10-CM | POA: Diagnosis not present

## 2018-01-07 DIAGNOSIS — E039 Hypothyroidism, unspecified: Secondary | ICD-10-CM | POA: Diagnosis not present

## 2018-01-07 DIAGNOSIS — Z681 Body mass index (BMI) 19 or less, adult: Secondary | ICD-10-CM | POA: Diagnosis not present

## 2018-01-07 DIAGNOSIS — N63 Unspecified lump in unspecified breast: Secondary | ICD-10-CM | POA: Diagnosis not present

## 2018-01-08 ENCOUNTER — Other Ambulatory Visit: Payer: Self-pay | Admitting: Internal Medicine

## 2018-01-08 DIAGNOSIS — N63 Unspecified lump in unspecified breast: Secondary | ICD-10-CM

## 2018-01-13 ENCOUNTER — Ambulatory Visit
Admission: RE | Admit: 2018-01-13 | Discharge: 2018-01-13 | Disposition: A | Discharge status: Home / Self Care | Payer: 59 | Source: Ambulatory Visit | Attending: Internal Medicine | Admitting: Internal Medicine

## 2018-01-13 DIAGNOSIS — N63 Unspecified lump in unspecified breast: Secondary | ICD-10-CM

## 2018-01-13 DIAGNOSIS — R928 Other abnormal and inconclusive findings on diagnostic imaging of breast: Secondary | ICD-10-CM | POA: Diagnosis not present

## 2018-01-13 DIAGNOSIS — N6310 Unspecified lump in the right breast, unspecified quadrant: Secondary | ICD-10-CM | POA: Diagnosis not present

## 2018-01-13 DIAGNOSIS — Z1212 Encounter for screening for malignant neoplasm of rectum: Secondary | ICD-10-CM | POA: Diagnosis not present

## 2018-02-03 MED FILL — PRAVASTATIN SODIUM 20 MG TA: 20 | 31 days supply | Qty: 31 | Fill #7

## 2018-03-10 MED FILL — SYNTHROID 50 MCG TABLET: 50 | 90 days supply | Qty: 90 | Fill #1

## 2018-03-10 MED FILL — metroNIDAZOLE 0.75 % CREA: 0.75 | 30 days supply | Qty: 45 | Fill #0

## 2018-03-10 MED FILL — PRAVASTATIN SODIUM 20 MG TA: 20 | 90 days supply | Qty: 90 | Fill #0

## 2018-03-25 ENCOUNTER — Other Ambulatory Visit: Payer: Self-pay | Admitting: Obstetrics & Gynecology

## 2018-03-25 DIAGNOSIS — Z1231 Encounter for screening mammogram for malignant neoplasm of breast: Secondary | ICD-10-CM

## 2018-04-18 ENCOUNTER — Ambulatory Visit
Admission: RE | Admit: 2018-04-18 | Discharge: 2018-04-18 | Disposition: A | Discharge status: Home / Self Care | Payer: PPO | Source: Ambulatory Visit | Attending: Obstetrics & Gynecology | Admitting: Obstetrics & Gynecology

## 2018-04-18 DIAGNOSIS — Z1231 Encounter for screening mammogram for malignant neoplasm of breast: Secondary | ICD-10-CM

## 2018-06-11 MED FILL — PRAVASTATIN SODIUM 20 MG TA: 20 | 90 days supply | Qty: 90 | Fill #1

## 2018-06-11 MED FILL — SYNTHROID 50 MCG TABLET: 50 | 90 days supply | Qty: 90 | Fill #2

## 2018-06-14 DIAGNOSIS — Z23 Encounter for immunization: Secondary | ICD-10-CM | POA: Diagnosis not present

## 2018-08-01 ENCOUNTER — Encounter: Payer: Self-pay | Admitting: Obstetrics & Gynecology

## 2018-08-01 ENCOUNTER — Ambulatory Visit (INDEPENDENT_AMBULATORY_CARE_PROVIDER_SITE_OTHER): Payer: PPO | Admitting: Obstetrics & Gynecology

## 2018-08-01 VITALS — BP 124/78 | Ht 66.25 in | Wt 123.0 lb

## 2018-08-01 DIAGNOSIS — Z01419 Encounter for gynecological examination (general) (routine) without abnormal findings: Secondary | ICD-10-CM | POA: Diagnosis not present

## 2018-08-01 DIAGNOSIS — Z780 Asymptomatic menopausal state: Secondary | ICD-10-CM

## 2018-08-01 DIAGNOSIS — M8589 Other specified disorders of bone density and structure, multiple sites: Secondary | ICD-10-CM

## 2018-08-01 DIAGNOSIS — Z1272 Encounter for screening for malignant neoplasm of vagina: Secondary | ICD-10-CM | POA: Diagnosis not present

## 2018-08-01 NOTE — Progress Notes (Signed)
Deborah Jones 11-15-1950 [id number]   History:    67 y.o. G0 Married.  Plays the flute.  RP:  Established patient presenting for annual gyn exam   HPI: Status post total hysterectomy.  Menopause, well on no hormone replacement therapy.  No pelvic pain.  Not sexually active.  Urine and bowel movements normal.  Breasts normal.  Had a right diagnostic mammogram in April 2019 showing a benign cyst.  Screening mammogram negative in July 2019.  Body mass index 19.7.  Stable since last year.  Health labs normal with family physician this year.  Past medical history,surgical history, family history and social history were all reviewed and documented in the EPIC chart.  Gynecologic History No LMP recorded. Patient has had a hysterectomy. Contraception: status post hysterectomy Last Pap: 06/2015. Results were: Negative Last mammogram: 03/2018. Results were: Negative Bone Density: 08/2017 Osteopenia Colonoscopy: 2015  Obstetric History OB History  Gravida Para Term Preterm AB Living  0            SAB TAB Ectopic Multiple Live Births                ROS: A ROS was performed and pertinent positives and negatives are included in the history.  GENERAL: No fevers or chills. HEENT: No change in vision, no earache, sore throat or sinus congestion. NECK: No pain or stiffness. CARDIOVASCULAR: No chest pain or pressure. No palpitations. PULMONARY: No shortness of breath, cough or wheeze. GASTROINTESTINAL: No abdominal pain, nausea, vomiting or diarrhea, melena or bright red blood per rectum. GENITOURINARY: No urinary frequency, urgency, hesitancy or dysuria. MUSCULOSKELETAL: No joint or muscle pain, no back pain, no recent trauma. DERMATOLOGIC: No rash, no itching, no lesions. ENDOCRINE: No polyuria, polydipsia, no heat or cold intolerance. No recent change in weight. HEMATOLOGICAL: No anemia or easy bruising or bleeding. NEUROLOGIC: No headache, seizures, numbness, tingling or weakness. PSYCHIATRIC: No  depression, no loss of interest in normal activity or change in sleep pattern.     Exam:   BP 124/78   Ht 5' 6.25" (1.683 m)   Wt 123 lb (55.8 kg)   BMI 19.70 kg/m   Body mass index is 19.7 kg/m.  General appearance : Well developed well nourished female. No acute distress HEENT: Eyes: no retinal hemorrhage or exudates,  Neck supple, trachea midline, no carotid bruits, no thyroidmegaly Lungs: Clear to auscultation, no rhonchi or wheezes, or rib retractions  Heart: Regular rate and rhythm, no murmurs or gallops Breast:Examined in sitting and supine position were symmetrical in appearance, no palpable masses or tenderness,  no skin retraction, no nipple inversion, no nipple discharge, no skin discoloration, no axillary or supraclavicular lymphadenopathy Abdomen: no palpable masses or tenderness, no rebound or guarding Extremities: no edema or skin discoloration or tenderness  Pelvic: Vulva: Normal             Vagina: No gross lesions or discharge.  Pap reflex done  Cervix/Uterus absent  Adnexa  Without masses or tenderness  Anus: Normal   Assessment/Plan:  67 y.o. female for annual exam   1. Encounter for routine gynecological examination with Papanicolaou smear of cervix Gynecologic exam status post total hysterectomy.  Pap reflex on vaginal vault done today.  Breast exam normal.  Last screening mammogram July 2019 was negative.  Health labs with family physician.  Colonoscopy in 2015.  Body mass index stable at 19.70.  Recommend a mild increase in calories, for example having smoothie snacks.  Continue with fitness.  2. Postmenopausal Well on no hormone replacement therapy.  3. Osteopenia of multiple sites Osteopenia on bone density December 2018.  Will repeat bone density in December 2020.  Vitamin D supplements, calcium intake of 1.5 g/day and regular weightbearing physical activities recommended.  Genia Del MD, 10:29 AM 08/01/2018

## 2018-08-03 ENCOUNTER — Encounter: Payer: Self-pay | Admitting: Obstetrics & Gynecology

## 2018-08-03 NOTE — Patient Instructions (Signed)
1. Encounter for routine gynecological examination with Papanicolaou smear of cervix Gynecologic exam status post total hysterectomy.  Pap reflex on vaginal vault done today.  Breast exam normal.  Last screening mammogram July 2019 was negative.  Health labs with family physician.  Colonoscopy in 2015.  Body mass index stable at 19.70.  Recommend a mild increase in calories, for example having smoothie snacks.  Continue with fitness.  2. Postmenopausal Well on no hormone replacement therapy.  3. Osteopenia of multiple sites Osteopenia on bone density December 2018.  Will repeat bone density in December 2020.  Vitamin D supplements, calcium intake of 1.5 g/day and regular weightbearing physical activities recommended.  Deborah Jones, it was a pleasure seeing you today!  I will inform you of your results as soon as they are available.

## 2018-08-04 LAB — PAP IG W/ RFLX HPV ASCU

## 2018-09-15 MED FILL — metroNIDAZOLE 0.75 % CREA: 0.75 | 30 days supply | Qty: 45 | Fill #1

## 2018-09-15 MED FILL — SYNTHROID 50 MCG TABLET: 50 | 90 days supply | Qty: 90 | Fill #0

## 2018-09-15 MED FILL — PRAVASTATIN SODIUM 20 MG TA: 20 | 90 days supply | Qty: 90 | Fill #2

## 2018-12-09 MED FILL — metroNIDAZOLE 0.75 % CREA: 0.75 | 30 days supply | Qty: 45 | Fill #2

## 2018-12-09 MED FILL — PRAVASTATIN SODIUM 20 MG TA: 20 | 90 days supply | Qty: 90 | Fill #0

## 2018-12-09 MED FILL — SYNTHROID 50 MCG TABLET: 50 | 90 days supply | Qty: 90 | Fill #1

## 2019-01-06 DIAGNOSIS — S0003XA Contusion of scalp, initial encounter: Secondary | ICD-10-CM | POA: Diagnosis not present

## 2019-01-06 DIAGNOSIS — Z741 Need for assistance with personal care: Secondary | ICD-10-CM | POA: Diagnosis not present

## 2019-01-06 DIAGNOSIS — J9610 Chronic respiratory failure, unspecified whether with hypoxia or hypercapnia: Secondary | ICD-10-CM | POA: Diagnosis not present

## 2019-01-06 DIAGNOSIS — E7800 Pure hypercholesterolemia, unspecified: Secondary | ICD-10-CM | POA: Diagnosis not present

## 2019-01-06 DIAGNOSIS — E782 Mixed hyperlipidemia: Secondary | ICD-10-CM | POA: Diagnosis not present

## 2019-01-06 DIAGNOSIS — E039 Hypothyroidism, unspecified: Secondary | ICD-10-CM | POA: Diagnosis not present

## 2019-01-06 DIAGNOSIS — I2510 Atherosclerotic heart disease of native coronary artery without angina pectoris: Secondary | ICD-10-CM | POA: Diagnosis not present

## 2019-01-06 DIAGNOSIS — S0990XA Unspecified injury of head, initial encounter: Secondary | ICD-10-CM | POA: Diagnosis not present

## 2019-01-06 DIAGNOSIS — E1122 Type 2 diabetes mellitus with diabetic chronic kidney disease: Secondary | ICD-10-CM | POA: Diagnosis not present

## 2019-01-06 DIAGNOSIS — E7849 Other hyperlipidemia: Secondary | ICD-10-CM | POA: Diagnosis not present

## 2019-01-06 DIAGNOSIS — R601 Generalized edema: Secondary | ICD-10-CM | POA: Diagnosis not present

## 2019-01-06 DIAGNOSIS — E119 Type 2 diabetes mellitus without complications: Secondary | ICD-10-CM | POA: Diagnosis not present

## 2019-01-06 DIAGNOSIS — N3945 Continuous leakage: Secondary | ICD-10-CM | POA: Diagnosis not present

## 2019-01-06 DIAGNOSIS — E559 Vitamin D deficiency, unspecified: Secondary | ICD-10-CM | POA: Diagnosis not present

## 2019-01-06 DIAGNOSIS — F322 Major depressive disorder, single episode, severe without psychotic features: Secondary | ICD-10-CM | POA: Diagnosis not present

## 2019-01-06 DIAGNOSIS — I10 Essential (primary) hypertension: Secondary | ICD-10-CM | POA: Diagnosis not present

## 2019-01-06 DIAGNOSIS — F419 Anxiety disorder, unspecified: Secondary | ICD-10-CM | POA: Diagnosis not present

## 2019-01-06 DIAGNOSIS — Z96652 Presence of left artificial knee joint: Secondary | ICD-10-CM | POA: Diagnosis not present

## 2019-01-06 DIAGNOSIS — G8929 Other chronic pain: Secondary | ICD-10-CM | POA: Diagnosis not present

## 2019-01-06 DIAGNOSIS — G20 Parkinson's disease: Secondary | ICD-10-CM | POA: Diagnosis not present

## 2019-01-06 DIAGNOSIS — E1165 Type 2 diabetes mellitus with hyperglycemia: Secondary | ICD-10-CM | POA: Diagnosis not present

## 2019-01-06 DIAGNOSIS — E038 Other specified hypothyroidism: Secondary | ICD-10-CM | POA: Diagnosis not present

## 2019-01-06 DIAGNOSIS — R569 Unspecified convulsions: Secondary | ICD-10-CM | POA: Diagnosis not present

## 2019-01-06 DIAGNOSIS — N183 Chronic kidney disease, stage 3 (moderate): Secondary | ICD-10-CM | POA: Diagnosis not present

## 2019-01-06 DIAGNOSIS — Z96651 Presence of right artificial knee joint: Secondary | ICD-10-CM | POA: Diagnosis not present

## 2019-01-06 DIAGNOSIS — J449 Chronic obstructive pulmonary disease, unspecified: Secondary | ICD-10-CM | POA: Diagnosis not present

## 2019-01-07 DIAGNOSIS — Z7689 Persons encountering health services in other specified circumstances: Secondary | ICD-10-CM | POA: Diagnosis not present

## 2019-01-07 DIAGNOSIS — H25013 Cortical age-related cataract, bilateral: Secondary | ICD-10-CM | POA: Diagnosis not present

## 2019-01-07 DIAGNOSIS — H2513 Age-related nuclear cataract, bilateral: Secondary | ICD-10-CM | POA: Diagnosis not present

## 2019-01-07 DIAGNOSIS — E7800 Pure hypercholesterolemia, unspecified: Secondary | ICD-10-CM | POA: Diagnosis not present

## 2019-01-13 DIAGNOSIS — Z0000 Encounter for general adult medical examination without abnormal findings: Secondary | ICD-10-CM | POA: Diagnosis not present

## 2019-01-13 DIAGNOSIS — M545 Low back pain: Secondary | ICD-10-CM | POA: Diagnosis not present

## 2019-01-13 DIAGNOSIS — Z1339 Encounter for screening examination for other mental health and behavioral disorders: Secondary | ICD-10-CM | POA: Diagnosis not present

## 2019-01-13 DIAGNOSIS — E039 Hypothyroidism, unspecified: Secondary | ICD-10-CM | POA: Diagnosis not present

## 2019-01-13 DIAGNOSIS — Z1331 Encounter for screening for depression: Secondary | ICD-10-CM | POA: Diagnosis not present

## 2019-01-13 DIAGNOSIS — I8390 Asymptomatic varicose veins of unspecified lower extremity: Secondary | ICD-10-CM | POA: Diagnosis not present

## 2019-01-13 DIAGNOSIS — E559 Vitamin D deficiency, unspecified: Secondary | ICD-10-CM | POA: Diagnosis not present

## 2019-01-13 DIAGNOSIS — E785 Hyperlipidemia, unspecified: Secondary | ICD-10-CM | POA: Diagnosis not present

## 2019-03-11 ENCOUNTER — Other Ambulatory Visit: Payer: Self-pay | Admitting: Obstetrics & Gynecology

## 2019-03-11 DIAGNOSIS — Z1231 Encounter for screening mammogram for malignant neoplasm of breast: Secondary | ICD-10-CM

## 2019-03-13 MED FILL — PRAVASTATIN SODIUM 20 MG TA: 20 | 90 days supply | Qty: 90 | Fill #1

## 2019-03-13 MED FILL — SYNTHROID 50 MCG TABLET: 50 | 90 days supply | Qty: 90 | Fill #2

## 2019-04-24 ENCOUNTER — Ambulatory Visit
Admission: RE | Admit: 2019-04-24 | Discharge: 2019-04-24 | Disposition: A | Discharge status: Home / Self Care | Payer: PPO | Source: Ambulatory Visit | Attending: Obstetrics & Gynecology | Admitting: Obstetrics & Gynecology

## 2019-04-24 ENCOUNTER — Other Ambulatory Visit: Payer: Self-pay

## 2019-04-24 DIAGNOSIS — Z1231 Encounter for screening mammogram for malignant neoplasm of breast: Secondary | ICD-10-CM | POA: Diagnosis not present

## 2019-05-21 DIAGNOSIS — Z23 Encounter for immunization: Secondary | ICD-10-CM | POA: Diagnosis not present

## 2019-06-01 MED FILL — SYNTHROID 50 MCG TABLET: 50 | 90 days supply | Qty: 90 | Fill #0

## 2019-06-01 MED FILL — PRAVASTATIN SODIUM 20 MG TA: 20 | 90 days supply | Qty: 90 | Fill #2

## 2019-06-01 MED FILL — metroNIDAZOLE 0.75 % CREA: 0.75 | 60 days supply | Qty: 90 | Fill #0

## 2019-07-08 ENCOUNTER — Encounter: Payer: Self-pay | Admitting: Gynecology

## 2019-07-22 MED FILL — MOXIFLOXACIN HCL 0.5 % SOLN: 0.5 | 7 days supply | Qty: 3 | Fill #0

## 2019-07-22 MED FILL — PREDNISOLONE AC 1% EYE DROP: 1 | 14 days supply | Qty: 10 | Fill #0

## 2019-07-30 DIAGNOSIS — H2512 Age-related nuclear cataract, left eye: Secondary | ICD-10-CM | POA: Diagnosis not present

## 2019-07-30 DIAGNOSIS — H25012 Cortical age-related cataract, left eye: Secondary | ICD-10-CM | POA: Diagnosis not present

## 2019-07-30 DIAGNOSIS — H25812 Combined forms of age-related cataract, left eye: Secondary | ICD-10-CM | POA: Diagnosis not present

## 2019-07-31 MED FILL — MOXIFLOXACIN HCL 0.5 % SOLN: 0.5 | 15 days supply | Qty: 3 | Fill #0

## 2019-08-02 HISTORY — PX: EYE SURGERY: SHX253

## 2019-08-03 MED FILL — PREDNISOLONE AC 1% EYE DROP: 1 | 50 days supply | Qty: 10 | Fill #0

## 2019-08-11 ENCOUNTER — Encounter: Payer: PPO | Admitting: Obstetrics & Gynecology

## 2019-08-13 DIAGNOSIS — H25811 Combined forms of age-related cataract, right eye: Secondary | ICD-10-CM | POA: Diagnosis not present

## 2019-08-13 DIAGNOSIS — H25011 Cortical age-related cataract, right eye: Secondary | ICD-10-CM | POA: Diagnosis not present

## 2019-08-13 DIAGNOSIS — H2511 Age-related nuclear cataract, right eye: Secondary | ICD-10-CM | POA: Diagnosis not present

## 2019-09-04 MED FILL — PRAVASTATIN SODIUM 20 MG TA: 20 | 90 days supply | Qty: 90 | Fill #0

## 2019-09-04 MED FILL — SYNTHROID 50 MCG TABLET: 50 | 90 days supply | Qty: 90 | Fill #1

## 2019-09-11 DIAGNOSIS — Z961 Presence of intraocular lens: Secondary | ICD-10-CM | POA: Diagnosis not present

## 2019-10-26 ENCOUNTER — Other Ambulatory Visit: Payer: Self-pay

## 2019-10-28 ENCOUNTER — Ambulatory Visit (INDEPENDENT_AMBULATORY_CARE_PROVIDER_SITE_OTHER): Payer: PPO | Admitting: Obstetrics & Gynecology

## 2019-10-28 ENCOUNTER — Other Ambulatory Visit: Payer: Self-pay

## 2019-10-28 ENCOUNTER — Encounter: Payer: Self-pay | Admitting: Obstetrics & Gynecology

## 2019-10-28 VITALS — Ht 65.0 in | Wt 119.0 lb

## 2019-10-28 DIAGNOSIS — Z780 Asymptomatic menopausal state: Secondary | ICD-10-CM

## 2019-10-28 DIAGNOSIS — R634 Abnormal weight loss: Secondary | ICD-10-CM

## 2019-10-28 DIAGNOSIS — Z90710 Acquired absence of both cervix and uterus: Secondary | ICD-10-CM | POA: Diagnosis not present

## 2019-10-28 DIAGNOSIS — M8589 Other specified disorders of bone density and structure, multiple sites: Secondary | ICD-10-CM | POA: Diagnosis not present

## 2019-10-28 DIAGNOSIS — Z01419 Encounter for gynecological examination (general) (routine) without abnormal findings: Secondary | ICD-10-CM | POA: Diagnosis not present

## 2019-10-28 NOTE — Progress Notes (Signed)
Deborah Jones 1951-07-22 [id number]   History:    69 y.o. G0 Married.  Plays the flute.  RP:  Established patient presenting for annual gyn exam   HPI: Status post total hysterectomy.  Menopause, well on no hormone replacement therapy.  No pelvic pain.  Not sexually active.  Urine and bowel movements normal.  Breasts normal.  Screening mammogram negative in July 2020. Body mass index 19.8.  Lost 4 Lbs x last year, concerned and trying to gain weight without success.  Health labs normal with family physician last year.   Past medical history,surgical history, family history and social history were all reviewed and documented in the EPIC chart.  Gynecologic History No LMP recorded. Patient has had a hysterectomy.  Obstetric History OB History  Gravida Para Term Preterm AB Living  0            SAB TAB Ectopic Multiple Live Births                ROS: A ROS was performed and pertinent positives and negatives are included in the history.  GENERAL: No fevers or chills. HEENT: No change in vision, no earache, sore throat or sinus congestion. NECK: No pain or stiffness. CARDIOVASCULAR: No chest pain or pressure. No palpitations. PULMONARY: No shortness of breath, cough or wheeze. GASTROINTESTINAL: No abdominal pain, nausea, vomiting or diarrhea, melena or bright red blood per rectum. GENITOURINARY: No urinary frequency, urgency, hesitancy or dysuria. MUSCULOSKELETAL: No joint or muscle pain, no back pain, no recent trauma. DERMATOLOGIC: No rash, no itching, no lesions. ENDOCRINE: No polyuria, polydipsia, no heat or cold intolerance. No recent change in weight. HEMATOLOGICAL: No anemia or easy bruising or bleeding. NEUROLOGIC: No headache, seizures, numbness, tingling or weakness. PSYCHIATRIC: No depression, no loss of interest in normal activity or change in sleep pattern.     Exam:   Ht 5\' 5"  (1.651 m)   Wt 119 lb (54 kg)   BMI 19.80 kg/m   Body mass index is 19.8  kg/m.  General appearance : Well developed well nourished female. No acute distress HEENT: Eyes: no retinal hemorrhage or exudates,  Neck supple, trachea midline, no carotid bruits, no thyroidmegaly Lungs: Clear to auscultation, no rhonchi or wheezes, or rib retractions  Heart: Regular rate and rhythm, no murmurs or gallops Breast:Examined in sitting and supine position were symmetrical in appearance, no palpable masses or tenderness,  no skin retraction, no nipple inversion, no nipple discharge, no skin discoloration, no axillary or supraclavicular lymphadenopathy Abdomen: no palpable masses or tenderness, no rebound or guarding Extremities: no edema or skin discoloration or tenderness  Pelvic: Vulva: Normal             Vagina: No gross lesions or discharge  Cervix/Uterus absent  Adnexa  Without masses or tenderness  Anus: Normal   Assessment/Plan:  69 y.o. female for annual exam   1. Well female exam with routine gynecological exam Gynecologic exam s/p Total Hysterectomy in menopause.  Pap negative 08/2018, no indication to repeat.  Breasts normal.  Screening mammo 04/2019 Negative.  Colono 2015.  Health Labs with Fam MD.  2. S/P total hysterectomy  3. Postmenopausal Well on no HRT.  4. Weight loss, unintentional Slow weight loss and difficulty gaining weight back.  Nutrition is adequate and healthy, but recommend increasing complex carb to bring total calories up.  Per patient, was started on Synthroid many years ago for symptoms, without a formal Dx of Hypothyroidism.  Recommend stopping Synthroid  until can gain weight.  Will decide based on weight and Thyroid panel results wether to restart Synthroid or not. - Thyroid Panel With TSH  5. Osteopenia of multiple sites Osteopenia on BD 08/2017.  Repeat BD now.  Vit D supplements, Ca++ intake of 1200 mg daily and regular weightbearing physical activities. - DG Bone Density; Future   Princess Bruins MD, 2:23 PM 10/28/2019

## 2019-10-28 NOTE — Patient Instructions (Signed)
1. Well female exam with routine gynecological exam Gynecologic exam s/p Total Hysterectomy in menopause.  Pap negative 08/2018, no indication to repeat.  Breasts normal.  Screening mammo 04/2019 Negative.  Colono 2015.  Health Labs with Fam MD.  2. S/P total hysterectomy  3. Postmenopausal Well on no HRT.  4. Weight loss, unintentional Slow weight loss and difficulty gaining weight back.  Nutrition is adequate and healthy, but recommend increasing complex carb to bring total calories up.  Per patient, was started on Synthroid many years ago for symptoms, without a formal Dx of Hypothyroidism.  Recommend stopping Synthroid until can gain weight.  Will decide based on weight and Thyroid panel results wether to restart Synthroid or not. - Thyroid Panel With TSH  5. Osteopenia of multiple sites Osteopenia on BD 08/2017.  Repeat BD now.  Vit D supplements, Ca++ intake of 1200 mg daily and regular weightbearing physical activities. - DG Bone Density; Future  Teela, it was a pleasure seeing you today!  I will inform you of your results as soon as they are available.

## 2019-10-29 ENCOUNTER — Other Ambulatory Visit: Payer: Self-pay

## 2019-10-29 DIAGNOSIS — R7989 Other specified abnormal findings of blood chemistry: Secondary | ICD-10-CM

## 2019-10-29 LAB — THYROID PANEL WITH TSH
Free Thyroxine Index: 2.9 (ref 1.4–3.8)
T3 Uptake: 32 % (ref 22–35)
T4, Total: 9.1 ug/dL (ref 5.1–11.9)
TSH: 1.37 mIU/L (ref 0.40–4.50)

## 2019-11-01 ENCOUNTER — Ambulatory Visit: Payer: PPO

## 2019-11-03 ENCOUNTER — Encounter: Payer: PPO | Admitting: Obstetrics & Gynecology

## 2019-11-11 ENCOUNTER — Other Ambulatory Visit: Payer: Self-pay

## 2019-11-12 ENCOUNTER — Ambulatory Visit (INDEPENDENT_AMBULATORY_CARE_PROVIDER_SITE_OTHER): Payer: PPO

## 2019-11-12 ENCOUNTER — Ambulatory Visit: Payer: PPO

## 2019-11-12 DIAGNOSIS — M8589 Other specified disorders of bone density and structure, multiple sites: Secondary | ICD-10-CM

## 2019-11-12 DIAGNOSIS — Z780 Asymptomatic menopausal state: Secondary | ICD-10-CM | POA: Diagnosis not present

## 2019-11-13 ENCOUNTER — Other Ambulatory Visit: Payer: Self-pay | Admitting: Obstetrics & Gynecology

## 2019-11-13 DIAGNOSIS — M8589 Other specified disorders of bone density and structure, multiple sites: Secondary | ICD-10-CM

## 2019-11-13 DIAGNOSIS — Z780 Asymptomatic menopausal state: Secondary | ICD-10-CM

## 2019-11-23 ENCOUNTER — Encounter: Payer: Self-pay | Admitting: Anesthesiology

## 2020-01-12 ENCOUNTER — Other Ambulatory Visit: Payer: PPO

## 2020-01-12 ENCOUNTER — Other Ambulatory Visit: Payer: Self-pay

## 2020-01-12 DIAGNOSIS — R7989 Other specified abnormal findings of blood chemistry: Secondary | ICD-10-CM | POA: Diagnosis not present

## 2020-01-13 LAB — THYROID PANEL WITH TSH
Free Thyroxine Index: 2.2 (ref 1.4–3.8)
T3 Uptake: 32 % (ref 22–35)
T4, Total: 7 ug/dL (ref 5.1–11.9)
TSH: 2.97 mIU/L (ref 0.40–4.50)

## 2020-01-25 ENCOUNTER — Other Ambulatory Visit: Payer: Self-pay

## 2020-01-26 ENCOUNTER — Encounter: Payer: Self-pay | Admitting: Obstetrics & Gynecology

## 2020-01-26 ENCOUNTER — Ambulatory Visit: Payer: PPO | Admitting: Obstetrics & Gynecology

## 2020-01-26 VITALS — BP 114/80 | Ht 66.0 in | Wt 114.0 lb

## 2020-01-26 DIAGNOSIS — R634 Abnormal weight loss: Secondary | ICD-10-CM

## 2020-01-26 NOTE — Progress Notes (Signed)
    Deborah Jones August 24, 1951 [id number]        69 y.o.  G0   RP:  Unexplained weight loss  HPI: When seen for Annual/Gyn exam 10/28/2019, patient had a Body mass index of 19.8, she had ost 4 Lbs x the year before and was concerned that in spite of trying to gain weight, she was actually loosing weight.  A Thyroid function panel was done then and repeated on 01/12/2020, it was completely normal both times.  Patient is very active physically and asymptomatic otherwise with no abdomino-pelvic pain and no PMB.  Her weight today is 114 Lbs, she has lost 5 Lbs since 10/28/2019.   OB History  Gravida Para Term Preterm AB Living  0            SAB TAB Ectopic Multiple Live Births               Past medical history,surgical history, problem list, medications, allergies, family history and social history were all reviewed and documented in the EPIC chart.   Directed ROS with pertinent positives and negatives documented in the history of present illness/assessment and plan.  Exam:  Vitals:   01/26/20 1558  BP: 114/80  Weight: 114 lb (51.7 kg)  Height: 5\' 6"  (1.676 m)   General appearance:  Normal  Gyn exam:  Deferred  Thyroid function panel:  Normal in 10/2019 and 01/12/2020.   Assessment/Plan:  69 y.o. G0P0   1. Weight loss, unintentional Patient had a complete physical exam in January 2021 which was normal.  She is completely asymptomatic except for her continued slow weight loss.  Thyroid panel normal in January and April 2021.  Decision to refer patient to nutritionist to assess the quality of her nutrition and ways to increase her total calories.  Also recommended to follow-up with her family physician especially if not successful in maintaining her weight or gaining weight through improvements in her nutrition.  May 2021 MD, 5:00 PM 01/26/2020

## 2020-01-27 ENCOUNTER — Encounter: Payer: Self-pay | Admitting: Obstetrics & Gynecology

## 2020-01-27 NOTE — Patient Instructions (Signed)
1. Weight loss, unintentional Patient had a complete physical exam in January 2021 which was normal.  She is completely asymptomatic except for her continued slow weight loss.  Thyroid panel normal in January and April 2021.  Decision to refer patient to nutritionist to assess the quality of her nutrition and ways to increase her total calories.  Also recommended to follow-up with her family physician especially if not successful in maintaining her weight or gaining weight through improvements in her nutrition.  Deborah Jones, it was a pleasure seeing you today!

## 2020-01-28 ENCOUNTER — Telehealth: Payer: Self-pay | Admitting: *Deleted

## 2020-01-28 DIAGNOSIS — R634 Abnormal weight loss: Secondary | ICD-10-CM

## 2020-01-28 NOTE — Telephone Encounter (Signed)
-----   Message from Genia Del, MD sent at 01/26/2020  5:02 PM EDT ----- Regarding: Refer to [facility] Slow but continued unintentional weight loss.  ROS Negative otherwise.  Lost 5 Lbs since Annual/Gyn exam 10/2019.  Refer to Nutritionist.  Will f/u with Dr Eloise Harman, her Family MD, going forward for that issue.

## 2020-01-28 NOTE — Telephone Encounter (Signed)
Referral placed at [facility] they will call to schedule. 

## 2020-01-28 NOTE — Telephone Encounter (Signed)
Patient called and left message c/o incorrect information on med list. I called to discuss, she was not able to talk at the time and said she will call me back.

## 2020-02-04 NOTE — Telephone Encounter (Signed)
Patient scheduled on 03/28/20

## 2020-03-28 ENCOUNTER — Encounter: Payer: PPO | Attending: Obstetrics & Gynecology | Admitting: Dietician

## 2020-03-28 ENCOUNTER — Other Ambulatory Visit: Payer: Self-pay

## 2020-03-28 ENCOUNTER — Encounter: Payer: Self-pay | Admitting: Dietician

## 2020-03-28 DIAGNOSIS — Z681 Body mass index (BMI) 19 or less, adult: Secondary | ICD-10-CM | POA: Insufficient documentation

## 2020-03-28 DIAGNOSIS — Z713 Dietary counseling and surveillance: Secondary | ICD-10-CM | POA: Insufficient documentation

## 2020-03-28 DIAGNOSIS — R634 Abnormal weight loss: Secondary | ICD-10-CM | POA: Diagnosis not present

## 2020-03-28 DIAGNOSIS — R636 Underweight: Secondary | ICD-10-CM

## 2020-03-28 NOTE — Patient Instructions (Addendum)
3 meals, 2 snacks daily Aim to go no more than 3 hours without eating a meal or snack. Add avocado, nut/seed butter, nuts, oil, whole milk yogurt to meals and snacks to boost calories. Consider adding Boost or Ensure (360 calorie) 1-2 per day and blending with frozen berries and/or banana and or nut butter if desired. Consider nut butter and jam on your toast.  Snack could be kind bar and milk or smoothie or nuts and fresh fruit.

## 2020-03-28 NOTE — Progress Notes (Signed)
Medical Nutrition Therapy:  Appt start time: 0905 end time:  0955.   Assessment:  Primary concerns today:  Patient is here today with her husband.  She states that she is having a problem maintaining her weight and this has been going on for some time.  History includes high cholesterol, GERD.  She states that she eats 20-30 minutes before going to bed and needs to change this.  She has been sleeping on a higher pillow which has helped. She states that she at times will have problems maintaining her blood sugar.  This happens most often mid morning.  It negatively effects her cognition.  She states that she treats this with a kind bar (nuts and chocolate). She states that she often feels foggy (cognitively) in the am and noted this am. Blood glucose checked this am was 102.   Appetite is good.  Weight: 114.8 lbs BMI 18.25 today (112 lbs at her home) 119 lbs 10/20/2019 Goal per patient:  More than 120 lbs 130's in her 40's and her husband states that Angelie thought that this was too high. She reports that she never dieted. Lowest adult weight 110 lbs  Patient lives with her husband.  He does most of the shopping and they share cooking.  He states that their schedules are different and do not always eat meals together. Skips meals at times as she gets busy. Her husband states that Adylene is used to eating small meals. 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 She enjoys gardening.   Preferred Learning Style:   No preference indicated   Learning Readiness:   Not ready  Contemplating  Ready  Change in progress   MEDICATIONS: see list to include vitamin D   DIETARY INTAKE:  Usual eating pattern includes 2-3 meals and 0-2 snacks per day.  24-hr recall:  B (9 AM): 1 cup oatmeal, blueberries, banana, 2% milk, walnuts, occasional low fat greek yogurt OR occasional egg, chicken and apple breakfast sausage, and toast with oil butter blend and  jam Snk ( AM): Kind bar if blood sugar seems low L ( PM): 1/2 c. refried beans, corn tortillas, cheese, tomatoes, and avocado OR occasional tuna sandwich, campbell's soup OR Falafel sandwich Snk ( PM): rare smoothie (homemade) D (10 PM):  2 oz. baked Chicken breast, rice, green beans Snk ( PM):  Beverages: 3 12 oz glasses 2% milk, coffee with condensed milk  Usual physical activity: gardening, walks 40 minutes about 3 days per week  Estimated energy needs: 2100 calories 60 g protein  Progress Towards Goal(s):  In progress.   Nutritional Diagnosis:  [place]-3.2 Unintentional weight loss As related to inadequate oral intake.  As evidenced by BMI of 18.25 and unintentional weight loss of 5 lbs in the past 5 months..    Intervention:  Nutrition education related to tips to gain weight. Provided patient with a chocolate and vanilla Boost.  Plan: 3 meals, 2 snacks daily Aim to go no more than 3 hours without eating a meal or snack. Add avocado, nut/seed butter, nuts, oil, whole milk yogurt to meals and snacks to boost calories. Consider adding Boost or Ensure (360 calorie) 1-2 per day and blending with frozen berries and/or banana and or nut butter if desired. Consider nut butter and jam on your toast. Snack could be kind bar and milk or smoothie or nuts and fresh fruit.  Teaching Method Utilized:  Auditory  Handouts given during visit include:  Underweight  nutrition therapy from [facility]  Barriers to learning/adherence to lifestyle change: see note  Demonstrated degree of understanding via:  Teach Back   Monitoring/Evaluation:  Dietary intake, exercise, and body weight prn.

## 2020-03-29 ENCOUNTER — Other Ambulatory Visit: Payer: Self-pay | Admitting: Obstetrics & Gynecology

## 2020-03-29 ENCOUNTER — Telehealth: Payer: Self-pay | Admitting: Dietician

## 2020-03-29 DIAGNOSIS — Z1231 Encounter for screening mammogram for malignant neoplasm of breast: Secondary | ICD-10-CM

## 2020-03-29 NOTE — Telephone Encounter (Signed)
Returned patient call.  Patient wished that I further update her Medication Record for accuracy.  This was completed.  She states that she has already started eating every 3 hours.  She states that her husband has put an alarm on her phone to remind her of meal or snack time. We discussed importance of proper nutrition and consistent energy supply to maintain her cognition.  We discussed other things that may effect this as well and for her to discuss this with her MD.  Patient to call for any further question.  Oran Rein, RD, LDN, CDCES

## 2020-03-29 NOTE — Addendum Note (Signed)
Addended by: Bonnita Levan on: 03/29/2020 12:10 PM   Modules accepted: Orders

## 2020-04-11 MED FILL — PRAVASTATIN SODIUM 20 MG TA: 20 | 90 days supply | Qty: 90 | Fill #1

## 2020-04-26 ENCOUNTER — Other Ambulatory Visit: Payer: Self-pay

## 2020-04-26 ENCOUNTER — Ambulatory Visit
Admission: RE | Admit: 2020-04-26 | Discharge: 2020-04-26 | Disposition: A | Discharge status: Home / Self Care | Payer: PPO | Source: Ambulatory Visit | Attending: Obstetrics & Gynecology | Admitting: Obstetrics & Gynecology

## 2020-04-26 DIAGNOSIS — Z1231 Encounter for screening mammogram for malignant neoplasm of breast: Secondary | ICD-10-CM | POA: Diagnosis not present

## 2020-07-02 DIAGNOSIS — Z23 Encounter for immunization: Secondary | ICD-10-CM | POA: Diagnosis not present

## 2020-08-12 DIAGNOSIS — R6889 Other general symptoms and signs: Secondary | ICD-10-CM | POA: Diagnosis not present

## 2020-08-12 DIAGNOSIS — E785 Hyperlipidemia, unspecified: Secondary | ICD-10-CM | POA: Diagnosis not present

## 2020-08-12 DIAGNOSIS — E039 Hypothyroidism, unspecified: Secondary | ICD-10-CM | POA: Diagnosis not present

## 2020-08-12 DIAGNOSIS — R413 Other amnesia: Secondary | ICD-10-CM | POA: Diagnosis not present

## 2020-08-17 ENCOUNTER — Other Ambulatory Visit: Payer: Self-pay | Admitting: Internal Medicine

## 2020-08-17 DIAGNOSIS — R6889 Other general symptoms and signs: Secondary | ICD-10-CM

## 2020-09-05 ENCOUNTER — Other Ambulatory Visit (HOSPITAL_COMMUNITY): Payer: Self-pay | Admitting: Internal Medicine

## 2020-09-05 MED FILL — metroNIDAZOLE 0.75 % CREA: 0.75 | 60 days supply | Qty: 90 | Fill #0

## 2020-09-07 ENCOUNTER — Ambulatory Visit
Admission: RE | Admit: 2020-09-07 | Discharge: 2020-09-07 | Disposition: A | Discharge status: Home / Self Care | Payer: PPO | Source: Ambulatory Visit | Attending: Internal Medicine | Admitting: Internal Medicine

## 2020-09-07 DIAGNOSIS — R6889 Other general symptoms and signs: Secondary | ICD-10-CM

## 2020-09-07 DIAGNOSIS — R413 Other amnesia: Secondary | ICD-10-CM | POA: Diagnosis not present

## 2020-09-14 DIAGNOSIS — H04123 Dry eye syndrome of bilateral lacrimal glands: Secondary | ICD-10-CM | POA: Diagnosis not present

## 2020-09-14 DIAGNOSIS — H26493 Other secondary cataract, bilateral: Secondary | ICD-10-CM | POA: Diagnosis not present

## 2020-09-14 DIAGNOSIS — H43813 Vitreous degeneration, bilateral: Secondary | ICD-10-CM | POA: Diagnosis not present

## 2020-09-14 DIAGNOSIS — H524 Presbyopia: Secondary | ICD-10-CM | POA: Diagnosis not present

## 2020-10-28 ENCOUNTER — Encounter: Payer: PPO | Admitting: Obstetrics & Gynecology

## 2020-11-01 ENCOUNTER — Ambulatory Visit: Payer: PPO | Admitting: Obstetrics & Gynecology

## 2020-11-01 ENCOUNTER — Encounter: Payer: Self-pay | Admitting: Obstetrics & Gynecology

## 2020-11-01 ENCOUNTER — Other Ambulatory Visit: Payer: Self-pay

## 2020-11-01 VITALS — BP 112/72 | HR 78 | Resp 14 | Ht 67.0 in | Wt 121.5 lb

## 2020-11-01 DIAGNOSIS — Z90710 Acquired absence of both cervix and uterus: Secondary | ICD-10-CM

## 2020-11-01 DIAGNOSIS — Z780 Asymptomatic menopausal state: Secondary | ICD-10-CM

## 2020-11-01 DIAGNOSIS — Z01419 Encounter for gynecological examination (general) (routine) without abnormal findings: Secondary | ICD-10-CM

## 2020-11-01 DIAGNOSIS — M8589 Other specified disorders of bone density and structure, multiple sites: Secondary | ICD-10-CM

## 2020-11-01 NOTE — Progress Notes (Signed)
Deborah Jones [birth year] [id number]   History:    70 y.o. G0 Married. Plays the flute.  BW:[id number] presenting for annual gyn exam   OMB:TDHRCB post total hysterectomy. Postmenopause, well on no hormone replacement therapy. No pelvic pain. Not sexually active. Urine and bowel movements normal. Breasts normal. Screening mammogram negative in July 2021. Body mass index 19.03, stable. Health labs normal with family physician last year.  Colono 2015.  Past medical history,surgical history, family history and social history were all reviewed and documented in the EPIC chart.  Gynecologic History No LMP recorded. Patient has had a hysterectomy.  Obstetric History OB History  Gravida Para Term Preterm AB Living  0            SAB IAB Ectopic Multiple Live Births                ROS: A ROS was performed and pertinent positives and negatives are included in the history.  GENERAL: No fevers or chills. HEENT: No change in vision, no earache, sore throat or sinus congestion. NECK: No pain or stiffness. CARDIOVASCULAR: No chest pain or pressure. No palpitations. PULMONARY: No shortness of breath, cough or wheeze. GASTROINTESTINAL: No abdominal pain, nausea, vomiting or diarrhea, melena or bright red blood per rectum. GENITOURINARY: No urinary frequency, urgency, hesitancy or dysuria. MUSCULOSKELETAL: No joint or muscle pain, no back pain, no recent trauma. DERMATOLOGIC: No rash, no itching, no lesions. ENDOCRINE: No polyuria, polydipsia, no heat or cold intolerance. No recent change in weight. HEMATOLOGICAL: No anemia or easy bruising or bleeding. NEUROLOGIC: No headache, seizures, numbness, tingling or weakness. PSYCHIATRIC: No depression, no loss of interest in normal activity or change in sleep pattern.     Exam:   BP 112/72 (BP Location: Right Arm, Patient Position: Sitting, Cuff Size: Normal)   Pulse 78   Resp 14   Ht 5\' 7"  (1.702 m)   Wt 121 lb 8 oz (55.1 kg)    BMI 19.03 kg/m   Body mass index is 19.03 kg/m.  General appearance : Well developed well nourished female. No acute distress HEENT: Eyes: no retinal hemorrhage or exudates,  Neck supple, trachea midline, no carotid bruits, no thyroidmegaly Lungs: Clear to auscultation, no rhonchi or wheezes, or rib retractions  Heart: Regular rate and rhythm, no murmurs or gallops Breast:Examined in sitting and supine position were symmetrical in appearance, no palpable masses or tenderness,  no skin retraction, no nipple inversion, no nipple discharge, no skin discoloration, no axillary or supraclavicular lymphadenopathy Abdomen: no palpable masses or tenderness, no rebound or guarding Extremities: no edema or skin discoloration or tenderness  Pelvic: Vulva: Normal             Vagina: No gross lesions or discharge  Cervix/Uterus absent  Adnexa  Without masses or tenderness  Anus: Normal   Assessment/Plan:  70 y.o. female for annual exam   1. Well female exam with routine gynecological exam Gynecologic exam in postmenopause, s/p total Hysterectomy.  Pap 08/2018 Neg, no indication to repeat at this time.  Breast exam normal.  Screening mammo Neg 03/2020.  Colono 2015.  Health labs with Fam MD.  Stable BMI at 19.03.  2. S/P total hysterectomy  3. Postmenopausal Well on no HRT.  4. Osteopenia of multiple sites Last BD 11/2019 with Osteopenia.  Continue Vit D supplements, Ca++ total intake at 1500 mg daily.  Weight bearing physical activities on a regular basis.  Repeat BD in 11/2021.  Other orders - CALCIUM  PO; Take 1,000 mg by mouth.  Genia Del MD, 9:25 AM 11/01/2020

## 2021-01-02 DIAGNOSIS — E039 Hypothyroidism, unspecified: Secondary | ICD-10-CM | POA: Diagnosis not present

## 2021-01-02 DIAGNOSIS — E785 Hyperlipidemia, unspecified: Secondary | ICD-10-CM | POA: Diagnosis not present

## 2021-01-02 DIAGNOSIS — E559 Vitamin D deficiency, unspecified: Secondary | ICD-10-CM | POA: Diagnosis not present

## 2021-01-09 DIAGNOSIS — R6889 Other general symptoms and signs: Secondary | ICD-10-CM | POA: Diagnosis not present

## 2021-01-09 DIAGNOSIS — Z0000 Encounter for general adult medical examination without abnormal findings: Secondary | ICD-10-CM | POA: Diagnosis not present

## 2021-01-09 DIAGNOSIS — R82998 Other abnormal findings in urine: Secondary | ICD-10-CM | POA: Diagnosis not present

## 2021-01-09 DIAGNOSIS — E785 Hyperlipidemia, unspecified: Secondary | ICD-10-CM | POA: Diagnosis not present

## 2021-01-09 DIAGNOSIS — E559 Vitamin D deficiency, unspecified: Secondary | ICD-10-CM | POA: Diagnosis not present

## 2021-03-23 ENCOUNTER — Other Ambulatory Visit (HOSPITAL_COMMUNITY): Payer: Self-pay

## 2021-03-23 MED FILL — Metronidazole Cream 0.75%: CUTANEOUS | 60 days supply | Qty: 90 | Fill #0 | Status: AC

## 2021-03-24 ENCOUNTER — Other Ambulatory Visit (HOSPITAL_COMMUNITY): Payer: Self-pay

## 2021-03-24 ENCOUNTER — Other Ambulatory Visit: Payer: Self-pay | Admitting: Obstetrics & Gynecology

## 2021-03-24 DIAGNOSIS — Z1231 Encounter for screening mammogram for malignant neoplasm of breast: Secondary | ICD-10-CM

## 2021-05-16 ENCOUNTER — Other Ambulatory Visit: Payer: Self-pay

## 2021-05-16 ENCOUNTER — Ambulatory Visit: Admission: RE | Admit: 2021-05-16 | Payer: PPO | Source: Ambulatory Visit

## 2021-05-16 DIAGNOSIS — Z1231 Encounter for screening mammogram for malignant neoplasm of breast: Secondary | ICD-10-CM

## 2021-06-01 ENCOUNTER — Other Ambulatory Visit: Payer: Self-pay

## 2021-06-01 ENCOUNTER — Ambulatory Visit
Admission: RE | Admit: 2021-06-01 | Discharge: 2021-06-01 | Disposition: A | Discharge status: Home / Self Care | Payer: PPO | Source: Ambulatory Visit | Attending: Obstetrics & Gynecology | Admitting: Obstetrics & Gynecology

## 2021-06-01 DIAGNOSIS — Z1231 Encounter for screening mammogram for malignant neoplasm of breast: Secondary | ICD-10-CM | POA: Diagnosis not present

## 2021-09-13 DIAGNOSIS — H04123 Dry eye syndrome of bilateral lacrimal glands: Secondary | ICD-10-CM | POA: Diagnosis not present

## 2021-09-13 DIAGNOSIS — H52203 Unspecified astigmatism, bilateral: Secondary | ICD-10-CM | POA: Diagnosis not present

## 2021-09-13 DIAGNOSIS — H26493 Other secondary cataract, bilateral: Secondary | ICD-10-CM | POA: Diagnosis not present

## 2021-09-13 DIAGNOSIS — H524 Presbyopia: Secondary | ICD-10-CM | POA: Diagnosis not present

## 2021-12-11 ENCOUNTER — Encounter: Payer: Self-pay | Admitting: Obstetrics & Gynecology

## 2021-12-11 ENCOUNTER — Ambulatory Visit (INDEPENDENT_AMBULATORY_CARE_PROVIDER_SITE_OTHER): Payer: PPO | Admitting: Obstetrics & Gynecology

## 2021-12-11 ENCOUNTER — Other Ambulatory Visit: Payer: Self-pay

## 2021-12-11 VITALS — BP 120/70 | HR 62 | Ht 66.5 in | Wt 124.6 lb

## 2021-12-11 DIAGNOSIS — Z90710 Acquired absence of both cervix and uterus: Secondary | ICD-10-CM | POA: Diagnosis not present

## 2021-12-11 DIAGNOSIS — M8589 Other specified disorders of bone density and structure, multiple sites: Secondary | ICD-10-CM | POA: Diagnosis not present

## 2021-12-11 DIAGNOSIS — Z780 Asymptomatic menopausal state: Secondary | ICD-10-CM

## 2021-12-11 DIAGNOSIS — Z01419 Encounter for gynecological examination (general) (routine) without abnormal findings: Secondary | ICD-10-CM

## 2021-12-11 NOTE — Progress Notes (Signed)
? ? ?Deborah Jones 12/27/1950 [id number] ? ? ?History:    71 y.o. . G0 Married.  Plays the flute.  Memory issues. ?  ?RP:  Established patient presenting for annual gyn exam  ?  ?HPI: Status post total hysterectomy.  Postmenopause, well on no hormone replacement therapy.  No pelvic pain.  Not sexually active. Pap Neg 08/2018.  No h/o abnormal Pap. Urine and bowel movements normal.  Breasts normal. Mammo 06/2021 Neg. Body mass index 19.81, stable.  Health labs normal with family physician last year. DEXA 11/12/19 Osteopenia Left Femoral Neck T-Score -1.7.  Will schedule a repeat BD here now.  Colono 11/2013.   ? ?Past medical history,surgical history, family history and social history were all reviewed and documented in the EPIC chart. ? ?Gynecologic History ?No LMP recorded. Patient has had a hysterectomy. ? ?Obstetric History ?OB History  ?Gravida Para Term Preterm AB Living  ?0            ?SAB IAB Ectopic Multiple Live Births  ?           ? ? ? ?ROS: A ROS was performed and pertinent positives and negatives are included in the history. ? GENERAL: No fevers or chills. HEENT: No change in vision, no earache, sore throat or sinus congestion. NECK: No pain or stiffness. CARDIOVASCULAR: No chest pain or pressure. No palpitations. PULMONARY: No shortness of breath, cough or wheeze. GASTROINTESTINAL: No abdominal pain, nausea, vomiting or diarrhea, melena or bright red blood per rectum. GENITOURINARY: No urinary frequency, urgency, hesitancy or dysuria. MUSCULOSKELETAL: No joint or muscle pain, no back pain, no recent trauma. DERMATOLOGIC: No rash, no itching, no lesions. ENDOCRINE: No polyuria, polydipsia, no heat or cold intolerance. No recent change in weight. HEMATOLOGICAL: No anemia or easy bruising or bleeding. NEUROLOGIC: No headache, seizures, numbness, tingling or weakness. PSYCHIATRIC: No depression, no loss of interest in normal activity or change in sleep pattern.  ?  ? ?Exam: ? ? ?BP 120/70   Pulse 62   Ht  5' 6.5" (1.689 m)   Wt 124 lb 9.6 oz (56.5 kg)   SpO2 99%   BMI 19.81 kg/m?  ? ?Body mass index is 19.81 kg/m?. ? ?General appearance : Well developed well nourished female. No acute distress ?HEENT: Eyes: no retinal hemorrhage or exudates,  Neck supple, trachea midline, no carotid bruits, no thyroidmegaly ?Lungs: Clear to auscultation, no rhonchi or wheezes, or rib retractions  ?Heart: Regular rate and rhythm, no murmurs or gallops ?Breast:Examined in sitting and supine position were symmetrical in appearance, no palpable masses or tenderness,  no skin retraction, no nipple inversion, no nipple discharge, no skin discoloration, no axillary or supraclavicular lymphadenopathy ?Abdomen: no palpable masses or tenderness, no rebound or guarding ?Extremities: no edema or skin discoloration or tenderness ? ?Pelvic: Vulva: Normal ?            Vagina: No gross lesions or discharge ? Cervix/Uterus absent ? Adnexa  Without masses or tenderness ? Anus: Normal ? ? ?Assessment/Plan:  71 y.o. female for annual exam  ? ?1. Well female exam with routine gynecological exam ?Status post total hysterectomy.  Postmenopause, well on no hormone replacement therapy.  No pelvic pain.  Not sexually active. Pap Neg 08/2018.  No h/o abnormal Pap. Urine and bowel movements normal.  Breasts normal. Mammo 06/2021 Neg. Body mass index 19.81, stable.  Health labs normal with family physician last year. DEXA 11/12/19 Osteopenia Left Femoral Neck T-Score -1.7.  Will schedule a repeat  BD here now.  Colono 11/2013.   ? ?2. S/P total hysterectomy ? ?3. Postmenopausal ?Status post total hysterectomy.  Postmenopause, well on no hormone replacement therapy.  No pelvic pain.  Not sexually active. ?- DG Bone Density; Future ? ?4. Osteopenia of multiple sites ?DEXA 11/12/19 Osteopenia Left Femoral Neck T-Score -1.7.  Will schedule a repeat BD here now. ?- DG Bone Density; Future  ? ?Genia Del MD, 9:16 AM 12/11/2021 ? ?  ?

## 2021-12-16 IMAGING — MG MM DIGITAL SCREENING BILAT W/ TOMO AND CAD
6 of 10 series · 6 of 30 positions shown · non-contrast
Comparison: Previous exam(s).

CLINICAL DATA: Screening.

EXAM:
DIGITAL SCREENING BILATERAL MAMMOGRAM WITH TOMOSYNTHESIS AND CAD
TECHNIQUE: Bilateral screening digital craniocaudal and mediolateral oblique
mammograms were obtained. Bilateral screening digital breast
tomosynthesis was performed. The images were evaluated with
computer-aided detection.

[R MLO synth-2D]
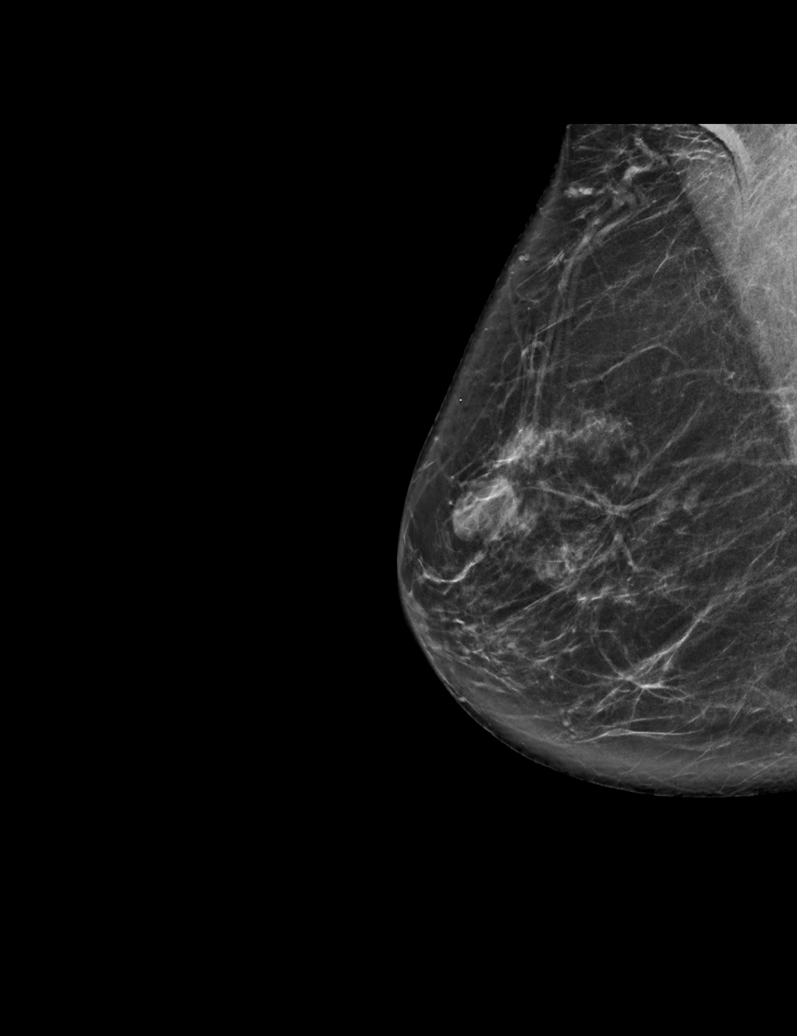

[R CC synth-2D (1 of 2)]
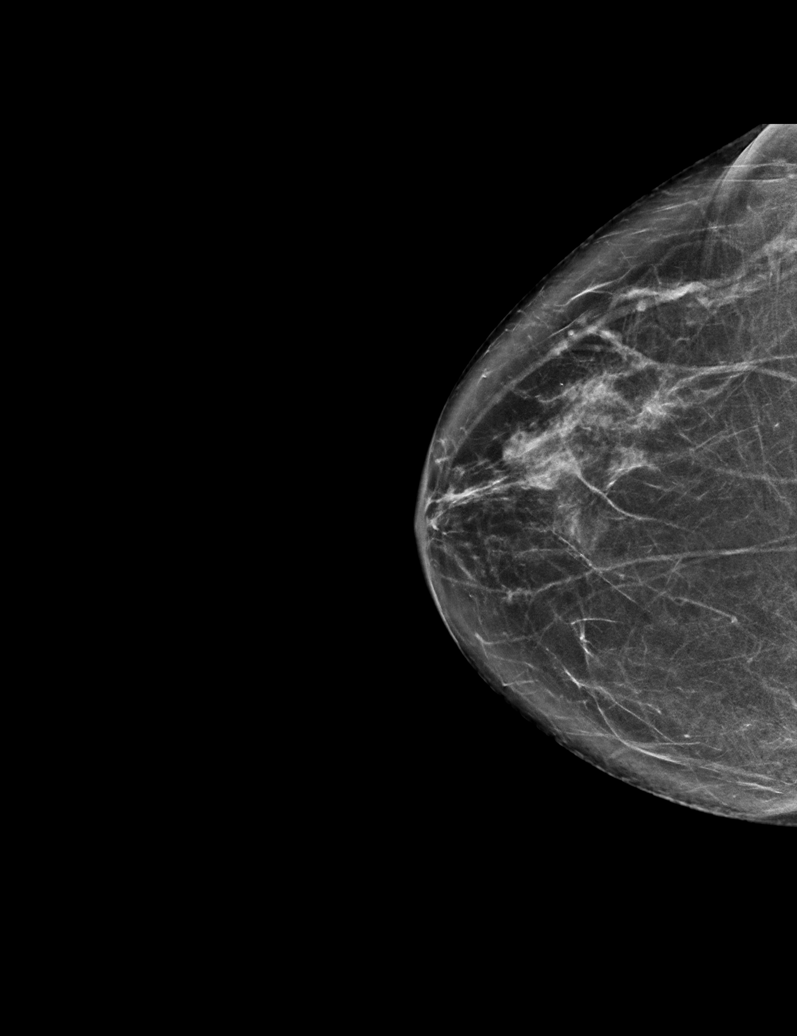

[R CC synth-2D (2 of 2)]
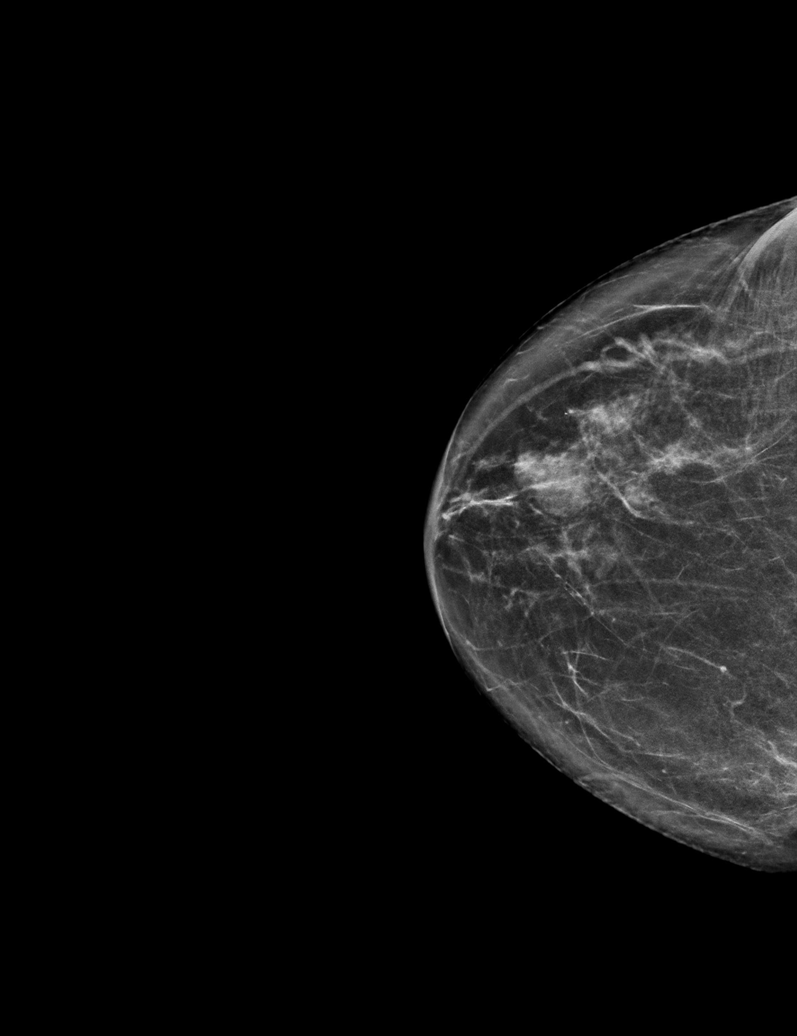

[L MLO synth-2D]
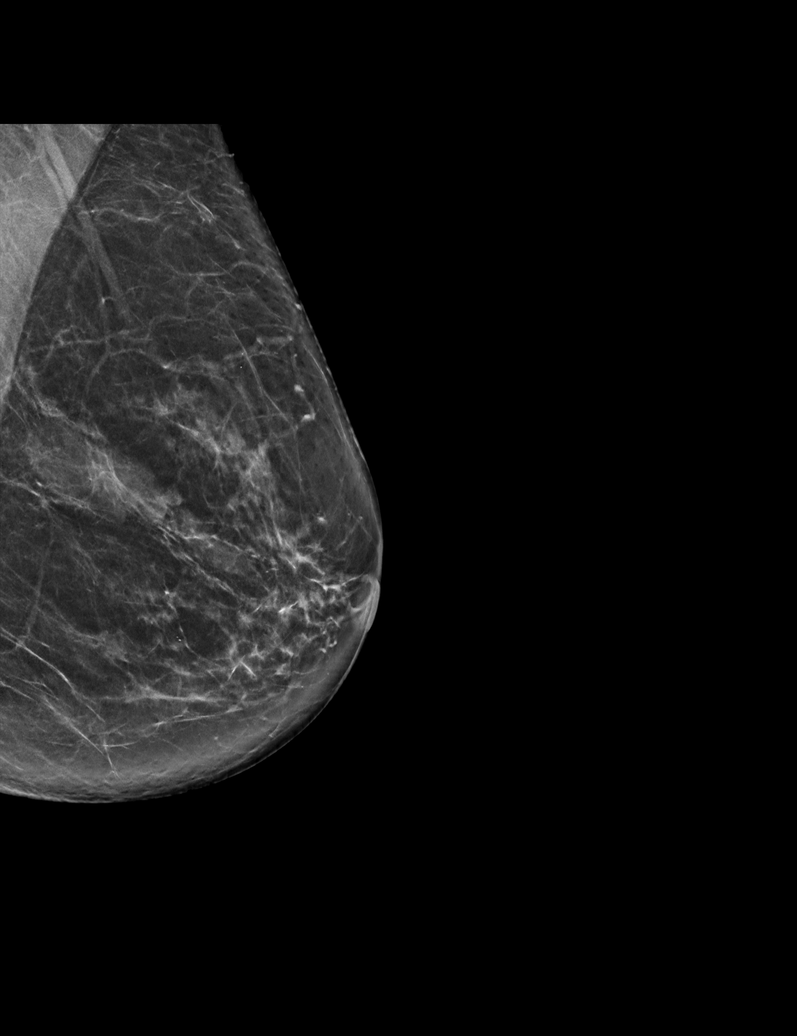

[L CC synth-2D]
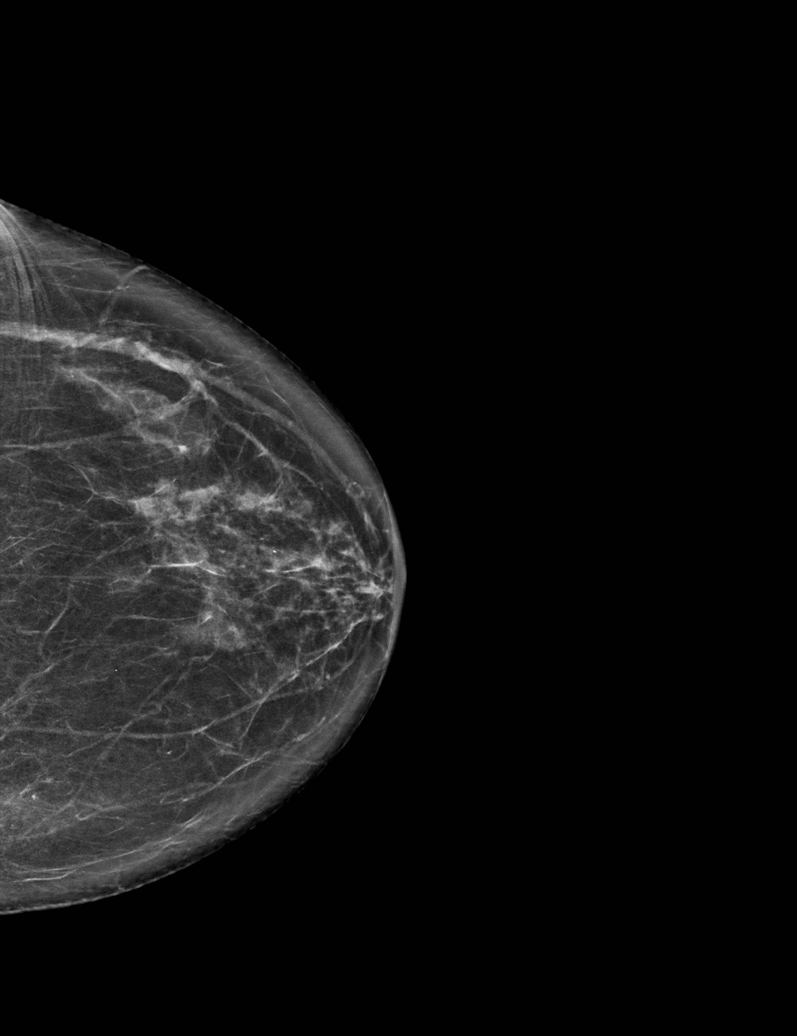

[L MLO tomo · tomo slice 34/67.0]
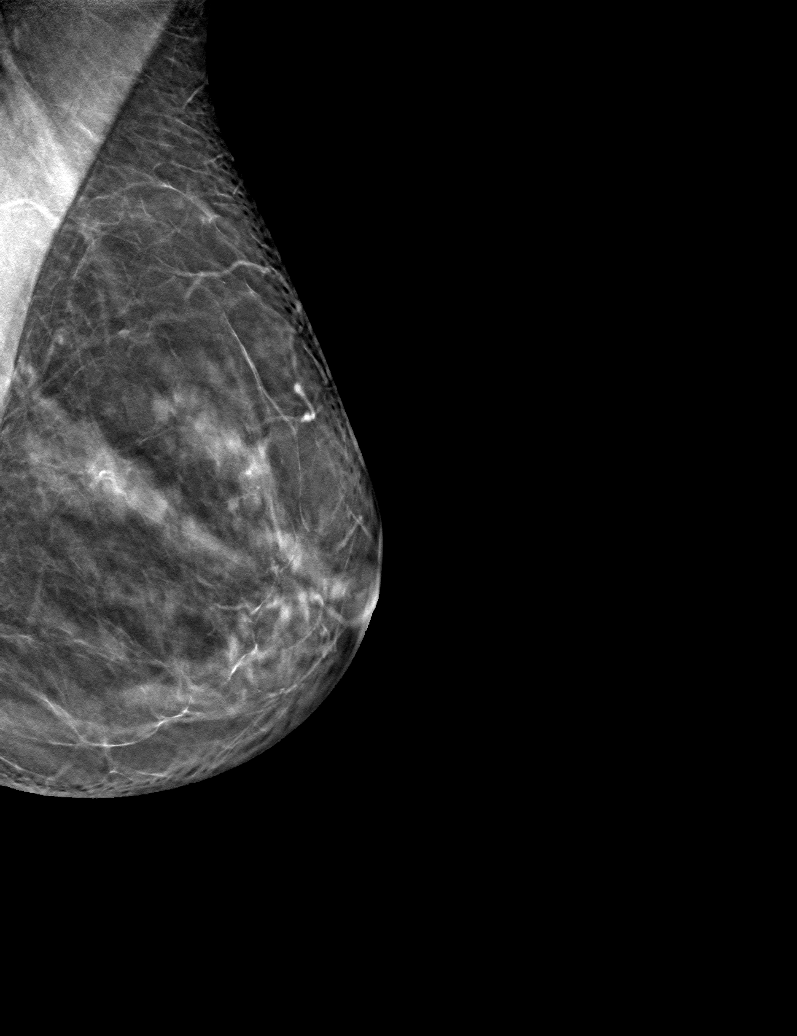

[6 of 30 positions shown; findings below may reference images not displayed]

ACR Breast Density Category b: There are scattered areas of
fibroglandular density.
FINDINGS: There are no findings suspicious for malignancy.
IMPRESSION: No mammographic evidence of malignancy. A result letter of this
screening mammogram will be mailed directly to the patient.

RECOMMENDATION:
Screening mammogram in one year. (Code:51-O-LD2)

BI-RADS CATEGORY  1: Negative.

## 2022-01-03 ENCOUNTER — Other Ambulatory Visit: Payer: Self-pay | Admitting: Obstetrics & Gynecology

## 2022-01-03 ENCOUNTER — Ambulatory Visit (INDEPENDENT_AMBULATORY_CARE_PROVIDER_SITE_OTHER): Payer: PPO

## 2022-01-03 DIAGNOSIS — M8589 Other specified disorders of bone density and structure, multiple sites: Secondary | ICD-10-CM

## 2022-01-03 DIAGNOSIS — Z780 Asymptomatic menopausal state: Secondary | ICD-10-CM | POA: Diagnosis not present

## 2022-01-25 DIAGNOSIS — E785 Hyperlipidemia, unspecified: Secondary | ICD-10-CM | POA: Diagnosis not present

## 2022-01-25 DIAGNOSIS — E039 Hypothyroidism, unspecified: Secondary | ICD-10-CM | POA: Diagnosis not present

## 2022-01-25 DIAGNOSIS — E559 Vitamin D deficiency, unspecified: Secondary | ICD-10-CM | POA: Diagnosis not present

## 2022-01-26 DIAGNOSIS — Z1212 Encounter for screening for malignant neoplasm of rectum: Secondary | ICD-10-CM | POA: Diagnosis not present

## 2022-01-26 DIAGNOSIS — R82998 Other abnormal findings in urine: Secondary | ICD-10-CM | POA: Diagnosis not present

## 2022-02-01 DIAGNOSIS — R6889 Other general symptoms and signs: Secondary | ICD-10-CM | POA: Diagnosis not present

## 2022-02-01 DIAGNOSIS — E039 Hypothyroidism, unspecified: Secondary | ICD-10-CM | POA: Diagnosis not present

## 2022-02-01 DIAGNOSIS — M7061 Trochanteric bursitis, right hip: Secondary | ICD-10-CM | POA: Diagnosis not present

## 2022-02-01 DIAGNOSIS — E785 Hyperlipidemia, unspecified: Secondary | ICD-10-CM | POA: Diagnosis not present

## 2022-02-01 DIAGNOSIS — Z0000 Encounter for general adult medical examination without abnormal findings: Secondary | ICD-10-CM | POA: Diagnosis not present

## 2022-03-12 DIAGNOSIS — R269 Unspecified abnormalities of gait and mobility: Secondary | ICD-10-CM | POA: Diagnosis not present

## 2022-03-12 DIAGNOSIS — M25551 Pain in right hip: Secondary | ICD-10-CM | POA: Diagnosis not present

## 2022-03-12 DIAGNOSIS — M7061 Trochanteric bursitis, right hip: Secondary | ICD-10-CM | POA: Diagnosis not present

## 2022-03-22 DIAGNOSIS — M7061 Trochanteric bursitis, right hip: Secondary | ICD-10-CM | POA: Diagnosis not present

## 2022-03-22 DIAGNOSIS — R269 Unspecified abnormalities of gait and mobility: Secondary | ICD-10-CM | POA: Diagnosis not present

## 2022-03-22 DIAGNOSIS — M25551 Pain in right hip: Secondary | ICD-10-CM | POA: Diagnosis not present

## 2022-03-29 DIAGNOSIS — M7061 Trochanteric bursitis, right hip: Secondary | ICD-10-CM | POA: Diagnosis not present

## 2022-03-29 DIAGNOSIS — R269 Unspecified abnormalities of gait and mobility: Secondary | ICD-10-CM | POA: Diagnosis not present

## 2022-03-29 DIAGNOSIS — M25551 Pain in right hip: Secondary | ICD-10-CM | POA: Diagnosis not present

## 2022-04-12 DIAGNOSIS — M7061 Trochanteric bursitis, right hip: Secondary | ICD-10-CM | POA: Diagnosis not present

## 2022-04-12 DIAGNOSIS — R269 Unspecified abnormalities of gait and mobility: Secondary | ICD-10-CM | POA: Diagnosis not present

## 2022-04-12 DIAGNOSIS — M25551 Pain in right hip: Secondary | ICD-10-CM | POA: Diagnosis not present

## 2022-07-09 ENCOUNTER — Other Ambulatory Visit: Payer: Self-pay | Admitting: Obstetrics & Gynecology

## 2022-07-09 DIAGNOSIS — Z1231 Encounter for screening mammogram for malignant neoplasm of breast: Secondary | ICD-10-CM

## 2022-07-11 ENCOUNTER — Ambulatory Visit
Admission: RE | Admit: 2022-07-11 | Discharge: 2022-07-11 | Disposition: A | Discharge status: Home / Self Care | Payer: PPO | Source: Ambulatory Visit

## 2022-07-11 DIAGNOSIS — Z1231 Encounter for screening mammogram for malignant neoplasm of breast: Secondary | ICD-10-CM | POA: Diagnosis not present

## 2022-07-13 ENCOUNTER — Other Ambulatory Visit: Payer: Self-pay | Admitting: Obstetrics & Gynecology

## 2022-07-13 DIAGNOSIS — R928 Other abnormal and inconclusive findings on diagnostic imaging of breast: Secondary | ICD-10-CM

## 2022-07-20 ENCOUNTER — Ambulatory Visit
Admission: RE | Admit: 2022-07-20 | Discharge: 2022-07-20 | Disposition: A | Discharge status: Home / Self Care | Payer: PPO | Source: Ambulatory Visit | Attending: Obstetrics & Gynecology | Admitting: Obstetrics & Gynecology

## 2022-07-20 ENCOUNTER — Other Ambulatory Visit: Payer: Self-pay | Admitting: Obstetrics & Gynecology

## 2022-07-20 DIAGNOSIS — R92322 Mammographic fibroglandular density, left breast: Secondary | ICD-10-CM | POA: Diagnosis not present

## 2022-07-20 DIAGNOSIS — R928 Other abnormal and inconclusive findings on diagnostic imaging of breast: Secondary | ICD-10-CM

## 2022-07-20 DIAGNOSIS — N6323 Unspecified lump in the left breast, lower outer quadrant: Secondary | ICD-10-CM | POA: Diagnosis not present

## 2022-07-20 DIAGNOSIS — N6320 Unspecified lump in the left breast, unspecified quadrant: Secondary | ICD-10-CM

## 2022-07-27 ENCOUNTER — Ambulatory Visit
Admission: RE | Admit: 2022-07-27 | Discharge: 2022-07-27 | Disposition: A | Discharge status: Home / Self Care | Payer: PPO | Source: Ambulatory Visit | Attending: Obstetrics & Gynecology | Admitting: Obstetrics & Gynecology

## 2022-07-27 DIAGNOSIS — N6320 Unspecified lump in the left breast, unspecified quadrant: Secondary | ICD-10-CM

## 2022-07-27 DIAGNOSIS — D0512 Intraductal carcinoma in situ of left breast: Secondary | ICD-10-CM | POA: Diagnosis not present

## 2022-07-27 DIAGNOSIS — N6323 Unspecified lump in the left breast, lower outer quadrant: Secondary | ICD-10-CM | POA: Diagnosis not present

## 2022-08-01 ENCOUNTER — Telehealth: Payer: Self-pay | Admitting: Hematology and Oncology

## 2022-08-01 NOTE — Telephone Encounter (Signed)
Spoke to patient to confirm upcoming afternoon [facility] clinic appointment on 11/8, paperwork will be sent via e-mail.   Gave location and time, also informed patient that the surgeon's office would be calling as well to get information from them similar to the packet that they will be receiving so make sure to do both.  Reminded patient that all providers will be coming to the clinic to see them HERE and if they had any questions to not hesitate to reach back out to myself or their navigators.

## 2022-08-06 ENCOUNTER — Encounter: Payer: Self-pay | Admitting: *Deleted

## 2022-08-06 DIAGNOSIS — D0512 Intraductal carcinoma in situ of left breast: Secondary | ICD-10-CM | POA: Insufficient documentation

## 2022-08-07 NOTE — Progress Notes (Signed)
Radiation Oncology         [phone] ________________________________  Multidisciplinary Breast Oncology Clinic [facility]) Initial Outpatient Consultation  Name: Deborah Jones MRN: [id number]  Date: 08/08/2022  DOB: April 01, 1951  HC:[id number], Barry Dienes, MD  Griselda Miner, MD   REFERRING PHYSICIAN: Chevis Pretty III, MD  DIAGNOSIS: The encounter diagnosis was Ductal carcinoma in situ (DCIS) of left breast.  Stage 0 (cTis (DCIS), cN0, cM0) Left Breast, Low-grade DCIS, ER+ / PR+ / Her2 not assessed    ICD-10-CM   1. Ductal carcinoma in situ (DCIS) of left breast  D05.12       HISTORY OF PRESENT ILLNESS::Deborah Jones is a 71 y.o. female who is presenting to the office today for evaluation of her newly diagnosed breast cancer. She is accompanied by her husband. She is doing well overall.   She had routine screening mammography on 07/11/22 showing a possible abnormality in the left breast. She underwent unilateral left breast diagnostic mammography with tomography and left breast ultrasonography at [facility]  on 07/20/22 showing: an irregular hypoechoic mass in the 4 o'clock left breast, 2 cmfn, measuring 4 x 3 x 4 mm, with internal vascularity. No abnormal left axillary lymph nodes were appreciated.   Biopsy of the 4 o'clock left breast on 07/27/22 showed: low grade DCIS involving a papillary lesion measuring 0.6 cm in the greatest linear extent of the sample. Prognostic indicators significant for: estrogen receptor, 100% positive and progesterone receptor, 80% positive, both with strong staining intensity. HER2 not assessed.  Menarche: 71 years old GP: 0 LMP: postmenopausal, but did not indicate approximated date on the provided form Contraceptive: yes, for 1 year (did no indicate when) HRT: did not indicate on the provided form   The patient was referred today for presentation in the multidisciplinary conference.  Radiology studies and pathology slides were presented there  for review and discussion of treatment options.  A consensus was discussed regarding potential next steps.  PREVIOUS RADIATION THERAPY: No  PAST MEDICAL HISTORY:  Past Medical History:  Diagnosis Date   Endometriosis    GERD (gastroesophageal reflux disease)    Hypothyroidism    Kidney stone    Low back pain    STARTED IN JULY 2012   Osteopenia 08/2017   T score -1.3 FRAX 7% / 0.7%   Rosacea     PAST SURGICAL HISTORY: Past Surgical History:  Procedure Laterality Date   ABDOMINAL HYSTERECTOMY     TAH/ WITH BILATERAL SALPINGECTOMIES AND  RIGHT OOPHORECTOMY    EYE SURGERY  08/2019   Cataracts Bilateral eyes   OOPHORECTOMY     RSO   US ECHOCARDIOGRAPHY  04/01/2009   EF 55-60%    FAMILY HISTORY:  Family History  Problem Relation Age of Onset   Hypertension Mother    Asthma Mother    Atrial fibrillation Father    Colon cancer Maternal Grandmother        dx 69s   Other Maternal Grandfather        brain tumor; dx 91s    SOCIAL HISTORY:  Social History   Socioeconomic History   Marital status: Married    Spouse name: Not on file   Number of children: Not on file   Years of education: Not on file   Highest education level: Not on file  Occupational History   Not on file  Tobacco Use   Smoking status: Never   Smokeless tobacco: Never  Vaping Use   Vaping  Use: Never used  Substance and Sexual Activity   Alcohol use: No    Alcohol/week: 0.0 standard drinks of alcohol   Drug use: No   Sexual activity: Not Currently    Comment: 1st intercourse 71 yo-Fewer than 5 partners  Other Topics Concern   Not on file  Social History Narrative   Not on file   Social Determinants of Health   Financial Resource Strain: Not on file  Food Insecurity: Not on file  Transportation Needs: Not on file  Physical Activity: Not on file  Stress: Not on file  Social Connections: Not on file    ALLERGIES: No Known Allergies  MEDICATIONS:  Current Outpatient Medications   Medication Sig Dispense Refill   CALCIUM PO Take 1,000 mg by mouth.     Cholecalciferol (VITAMIN D3) 2000 UNITS TABS Take 1,000 Units by mouth daily.      Menatetrenone (VITAMIN K2) 100 MCG TABS 2 capsules Orally once daily     metroNIDAZOLE (METROCREAM) 0.75 % cream Apply topically 2 (two) times daily.     VITAMIN K PO Take 30 mg by mouth daily.     No current facility-administered medications for this encounter.    REVIEW OF SYSTEMS: A 10+ POINT REVIEW OF SYSTEMS WAS OBTAINED including neurology, dermatology, psychiatry, cardiac, respiratory, lymph, extremities, GI, GU, musculoskeletal, constitutional, reproductive, HEENT. On the provided form, she reports sleeping with 2 pillows. She denies any other symptoms.    PHYSICAL EXAM:     08/08/2022  Vitals with BMI   Height 5\' 6"    Weight 127 lbs 14 oz   BMI 20.65   Systolic 115   Diastolic 69   Pulse 71    Lungs are clear to auscultation bilaterally. Heart has regular rate and rhythm. No palpable cervical, supraclavicular, or axillary adenopathy. Abdomen soft, non-tender, normal bowel sounds. Breast: Right breast with no palpable mass, nipple discharge, or bleeding. Left breast with significant bruising in the LOQ from biopsy.   KPS = 90  100 - Normal; no complaints; no evidence of disease. 90   - Able to carry on normal activity; minor signs or symptoms of disease. 80   - Normal activity with effort; some signs or symptoms of disease. 13   - Cares for self; unable to carry on normal activity or to do active work. 60   - Requires occasional assistance, but is able to care for most of his personal needs. 50   - Requires considerable assistance and frequent medical care. 40   - Disabled; requires special care and assistance. 30   - Severely disabled; hospital admission is indicated although death not imminent. 20   - Very sick; hospital admission necessary; active supportive treatment necessary. 10   - Moribund; fatal processes  progressing rapidly. 0     - Dead  Karnofsky DA, Abelmann WH, Craver LS and Burchenal [facility] [phone]) The use of the nitrogen mustards in the palliative treatment of carcinoma: with particular reference to bronchogenic carcinoma Cancer 1 [id number]  LABORATORY DATA:  Lab Results  Component Value Date   WBC 5.0 08/08/2022   HGB 12.9 08/08/2022   HCT 37.4 08/08/2022   MCV 93.5 08/08/2022   PLT 183 08/08/2022   Lab Results  Component Value Date   NA 139 08/08/2022   K 4.2 08/08/2022   CL 106 08/08/2022   CO2 28 08/08/2022   Lab Results  Component Value Date   ALT 13 08/08/2022   AST 24 08/08/2022   ALKPHOS  64 08/08/2022   BILITOT 0.5 08/08/2022    PULMONARY FUNCTION TEST:   Review Flowsheet        No data to display          RADIOGRAPHY: [place] LT BREAST BX W LOC DEV 1ST LESION IMG BX SPEC US GUIDE  Addendum Date: 08/01/2022   ADDENDUM REPORT: 08/01/2022 07:51 ADDENDUM: Pathology revealed DUCTAL CARCINOMA IN SITU, LOW GRADE, INVOLVING A PAPILLARY LESION of the LEFT breast, 4:00 o'clock, (ribbon clip). This was found to be concordant by Dr. Laveda Abbe. Pathology results were discussed with the patient and her husband by telephone. The patient reported doing well after the biopsy with minimal tenderness at the site. Post biopsy instructions and care were reviewed and questions were answered. The patient was encouraged to call [facility] for any additional concerns. My direct phone number was provided. The patient was referred to [facility] on August 08, 2022. Pathology results reported by Rene Kocher, RN on 07/31/2022. Electronically Signed   By: Harmon Pier M.D.   On: 08/01/2022 07:51   Result Date: 08/01/2022 CLINICAL DATA:  71 year old female for tissue sampling of 0.4 cm LEFT breast mass. EXAM: ULTRASOUND GUIDED LEFT BREAST CORE NEEDLE BIOPSY COMPARISON:  Previous exam(s). PROCEDURE: I  met with the patient and we discussed the procedure of ultrasound-guided biopsy, including benefits and alternatives. We discussed the high likelihood of a successful procedure. We discussed the risks of the procedure, including infection, bleeding, tissue injury, clip migration, and inadequate sampling. Informed written consent was given. The usual time-out protocol was performed immediately prior to the procedure. Lesion quadrant: LOWER OUTER LEFT breast Using sterile technique and 1% Lidocaine as local anesthetic, under direct ultrasound visualization, a 12 gauge spring-loaded device was used to perform biopsy of the 0.4 cm mass at the 4 o'clock position of the LEFT breast 2 cm from the nipple using a MEDIAL approach. At the conclusion of the procedure a RIBBON shaped tissue marker clip was deployed into the biopsy cavity. Follow up 2 view mammogram was performed and dictated separately. IMPRESSION: Ultrasound guided biopsy of 0.4 cm LOWER OUTER LEFT breast mass. No apparent complications. Electronically Signed: By: Harmon Pier M.D. On: 07/27/2022 12:05  MM CLIP PLACEMENT LEFT  Result Date: 07/27/2022 CLINICAL DATA:  Evaluate RIBBON clip placement following ultrasound-guided LEFT breast biopsy. EXAM: 3D DIAGNOSTIC LEFT MAMMOGRAM POST ULTRASOUND BIOPSY COMPARISON:  Previous exam(s). FINDINGS: 3D Mammographic images were obtained following ultrasound guided biopsy of the 0.4 cm mass at the 4 o'clock position of the LEFT breast. The RIBBON biopsy marking clip is in expected position at the site of biopsy. IMPRESSION: Appropriate positioning of the RIBBON shaped biopsy marking clip at the site of biopsy in the LOWER OUTER LEFT breast. Final Assessment: Post Procedure Mammograms for Marker Placement Electronically Signed   By: Harmon Pier M.D.   On: 07/27/2022 12:16  MM DIAG BREAST TOMO UNI LEFT  Result Date: 07/20/2022 CLINICAL DATA:  Patient returns today to evaluate a possible LEFT breast mass identified  on recent screening mammogram. History of bilateral cysts/fibrocystic change. EXAM: DIGITAL DIAGNOSTIC UNILATERAL LEFT MAMMOGRAM WITH TOMOSYNTHESIS; ULTRASOUND LEFT BREAST LIMITED TECHNIQUE: Left digital diagnostic mammography and breast tomosynthesis was performed.; Targeted ultrasound examination of the left breast was performed. COMPARISON:  Previous exams including recent screening mammogram dated 07/11/2022. ACR Breast Density Category b: There are scattered areas of fibroglandular density. FINDINGS: On today's additional diagnostic  views, partially circumscribed partially obscured mass is confirmed within the slightly outer LEFT breast, 4 o'clock axis region, measuring approximately 5 mm greatest dimension. Targeted ultrasound is performed, showing an irregular hypoechoic mass in the LEFT breast at the 4 o'clock axis, 2 cm from the nipple, measuring 4 x 3 x 4 mm, with internal vascularity, corresponding to the mammographic finding. LEFT axilla was evaluated with ultrasound showing no enlarged or morphologically abnormal lymph nodes. IMPRESSION: Irregular hypoechoic mass in the LEFT breast at the 4 o'clock axis, 2 cm from the nipple, measuring 4 mm, with internal vascularity, corresponding to the mammographic finding. Ultrasound-guided biopsy is recommended to exclude malignancy. RECOMMENDATION: 1. Ultrasound-guided biopsy for the LEFT breast mass at the 4 o'clock axis, measuring 4 mm, corresponding to the mammographic finding. 2. Attention to the postprocedure mammogram to ensure mammographic and sonographic correspondence. Ultrasound-guided biopsy is scheduled on October 27th. I have discussed the findings and recommendations with the patient. If applicable, a reminder letter will be sent to the patient regarding the next appointment. BI-RADS CATEGORY  4: Suspicious. Electronically Signed   By: Bary Richard M.D.   On: 07/20/2022 09:59  US BREAST LTD UNI LEFT INC AXILLA  Result Date: 07/20/2022 CLINICAL  DATA:  Patient returns today to evaluate a possible LEFT breast mass identified on recent screening mammogram. History of bilateral cysts/fibrocystic change. EXAM: DIGITAL DIAGNOSTIC UNILATERAL LEFT MAMMOGRAM WITH TOMOSYNTHESIS; ULTRASOUND LEFT BREAST LIMITED TECHNIQUE: Left digital diagnostic mammography and breast tomosynthesis was performed.; Targeted ultrasound examination of the left breast was performed. COMPARISON:  Previous exams including recent screening mammogram dated 07/11/2022. ACR Breast Density Category b: There are scattered areas of fibroglandular density. FINDINGS: On today's additional diagnostic views, partially circumscribed partially obscured mass is confirmed within the slightly outer LEFT breast, 4 o'clock axis region, measuring approximately 5 mm greatest dimension. Targeted ultrasound is performed, showing an irregular hypoechoic mass in the LEFT breast at the 4 o'clock axis, 2 cm from the nipple, measuring 4 x 3 x 4 mm, with internal vascularity, corresponding to the mammographic finding. LEFT axilla was evaluated with ultrasound showing no enlarged or morphologically abnormal lymph nodes. IMPRESSION: Irregular hypoechoic mass in the LEFT breast at the 4 o'clock axis, 2 cm from the nipple, measuring 4 mm, with internal vascularity, corresponding to the mammographic finding. Ultrasound-guided biopsy is recommended to exclude malignancy. RECOMMENDATION: 1. Ultrasound-guided biopsy for the LEFT breast mass at the 4 o'clock axis, measuring 4 mm, corresponding to the mammographic finding. 2. Attention to the postprocedure mammogram to ensure mammographic and sonographic correspondence. Ultrasound-guided biopsy is scheduled on October 27th. I have discussed the findings and recommendations with the patient. If applicable, a reminder letter will be sent to the patient regarding the next appointment. BI-RADS CATEGORY  4: Suspicious. Electronically Signed   By: Bary Richard M.D.   On: 07/20/2022  09:59  MM 3D SCREEN BREAST BILATERAL  Result Date: 07/12/2022 CLINICAL DATA:  Screening. EXAM: DIGITAL SCREENING BILATERAL MAMMOGRAM WITH TOMOSYNTHESIS AND CAD TECHNIQUE: Bilateral screening digital craniocaudal and mediolateral oblique mammograms were obtained. Bilateral screening digital breast tomosynthesis was performed. The images were evaluated with computer-aided detection. COMPARISON:  Previous exam(s). ACR Breast Density Category b: There are scattered areas of fibroglandular density. FINDINGS: In the left breast, a possible mass warrants further evaluation. In the right breast, no findings suspicious for malignancy. IMPRESSION: Further evaluation is suggested for a possible mass in the left breast. RECOMMENDATION: Diagnostic mammogram and possibly ultrasound of the left breast. (Code:[id number]) The patient will  be contacted regarding the findings, and additional imaging will be scheduled. BI-RADS CATEGORY  0: Incomplete. Need additional imaging evaluation and/or prior mammograms for comparison. Electronically Signed   By: Harmon Pier M.D.   On: 07/12/2022 10:28      IMPRESSION: Stage 0 (cTis (DCIS), cN0, cM0) Left Breast, Low-grade DCIS, ER+ / PR+ / Her2 not assessed   Patient will be a good candidate for breast conservation with radiotherapy to the left breast. We discussed the general course of radiation, potential side effects, and toxicities with radiation and the patient is interested in this approach if recommended.  Patient would appear to have an excellent prognosis. If she proceeds with adjuvant hormonal therapy, she may be able to avoid radiation therapy. We discussed this and we will make a final determination after her surgery is complete.    PLAN:  Genetics Left lumpectomy  May avoid radiation depending on final pathology given her age and excellent prognosis Aromatase inhibitor     ------------------------------------------------  Billie Lade, PhD, MD  This document  serves as a record of services personally performed by Antony Blackbird, MD. It was created on his behalf by Neena Rhymes, a trained medical scribe. The creation of this record is based on the scribe's personal observations and the provider's statements to them. This document has been checked and approved by the attending provider.

## 2022-08-08 ENCOUNTER — Encounter: Payer: Self-pay | Admitting: *Deleted

## 2022-08-08 ENCOUNTER — Ambulatory Visit: Payer: Self-pay | Admitting: General Surgery

## 2022-08-08 ENCOUNTER — Ambulatory Visit
Admission: RE | Admit: 2022-08-08 | Discharge: 2022-08-08 | Disposition: A | Discharge status: Home / Self Care | Payer: PPO | Source: Ambulatory Visit | Attending: Radiation Oncology | Admitting: Radiation Oncology

## 2022-08-08 ENCOUNTER — Inpatient Hospital Stay (HOSPITAL_BASED_OUTPATIENT_CLINIC_OR_DEPARTMENT_OTHER): Payer: PPO | Admitting: Hematology and Oncology

## 2022-08-08 ENCOUNTER — Ambulatory Visit: Payer: Self-pay | Admitting: Genetic Counselor

## 2022-08-08 ENCOUNTER — Inpatient Hospital Stay: Payer: PPO | Attending: Hematology and Oncology

## 2022-08-08 ENCOUNTER — Ambulatory Visit: Payer: PPO | Admitting: Physical Therapy

## 2022-08-08 VITALS — BP 115/69 | HR 71 | Temp 98.1°F | Resp 18 | Ht 66.0 in | Wt 127.9 lb

## 2022-08-08 DIAGNOSIS — Z170 Estrogen receptor positive status [ER+]: Secondary | ICD-10-CM | POA: Diagnosis not present

## 2022-08-08 DIAGNOSIS — Z803 Family history of malignant neoplasm of breast: Secondary | ICD-10-CM | POA: Diagnosis not present

## 2022-08-08 DIAGNOSIS — D0512 Intraductal carcinoma in situ of left breast: Secondary | ICD-10-CM | POA: Insufficient documentation

## 2022-08-08 LAB — CBC WITH DIFFERENTIAL (CANCER CENTER ONLY)
Abs Immature Granulocytes: 0.01 10*3/uL (ref 0.00–0.07)
Basophils Absolute: 0 10*3/uL (ref 0.0–0.1)
Basophils Relative: 0 %
Eosinophils Absolute: 0.1 10*3/uL (ref 0.0–0.5)
Eosinophils Relative: 2 %
HCT: 37.4 % (ref 36.0–46.0)
Hemoglobin: 12.9 g/dL (ref 12.0–15.0)
Immature Granulocytes: 0 %
Lymphocytes Relative: 36 %
Lymphs Abs: 1.8 10*3/uL (ref 0.7–4.0)
MCH: 32.3 pg (ref 26.0–34.0)
MCHC: 34.5 g/dL (ref 30.0–36.0)
MCV: 93.5 fL (ref 80.0–100.0)
Monocytes Absolute: 0.3 10*3/uL (ref 0.1–1.0)
Monocytes Relative: 6 %
Neutro Abs: 2.8 10*3/uL (ref 1.7–7.7)
Neutrophils Relative %: 56 %
Platelet Count: 183 10*3/uL (ref 150–400)
RBC: 4 MIL/uL (ref 3.87–5.11)
RDW: 12.8 % (ref 11.5–15.5)
WBC Count: 5 10*3/uL (ref 4.0–10.5)
nRBC: 0 % (ref 0.0–0.2)

## 2022-08-08 LAB — CMP (CANCER CENTER ONLY)
ALT: 13 U/L (ref 0–44)
AST: 24 U/L (ref 15–41)
Albumin: 4 g/dL (ref 3.5–5.0)
Alkaline Phosphatase: 64 U/L (ref 38–126)
Anion gap: 5 (ref 5–15)
BUN: 24 mg/dL — ABNORMAL HIGH (ref 8–23)
CO2: 28 mmol/L (ref 22–32)
Calcium: 9 mg/dL (ref 8.9–10.3)
Chloride: 106 mmol/L (ref 98–111)
Creatinine: 0.8 mg/dL (ref 0.44–1.00)
GFR, Estimated: >60 mL/min (ref >60)
Glucose, Bld: 88 mg/dL (ref 70–99)
Potassium: 4.2 mmol/L (ref 3.5–5.1)
Sodium: 139 mmol/L (ref 135–145)
Total Bilirubin: 0.5 mg/dL (ref 0.3–1.2)
Total Protein: 6.8 g/dL (ref 6.5–8.1)

## 2022-08-08 LAB — GENETIC SCREENING ORDER

## 2022-08-08 NOTE — Progress Notes (Signed)
Deborah Jones was seen by a genetic counselor during the breast multidisciplinary clinic on 08/08/2022. In addition to her personal history of breast cancer, she reported a family history of colon cancer in her maternal grandmother and a brain tumor in her maternal grandfather. She does not meet NCCN criteria for genetic testing at this time. She was still offered genetic counseling and testing but declined. We encourage her to contact us if there are any changes to her personal or family history of cancer. If she meets NCCN criteria based on the updated personal/family history, she would be recommended to have genetic counseling and testing.

## 2022-08-08 NOTE — Progress Notes (Signed)
[place] Psychosocial Distress Screening Spiritual Care  Met with Katriel and her husband in [facility] to introduce [place] team/resources, reviewing distress screen per protocol.  The patient scored a 7 on the Psychosocial Distress Thermometer which indicates moderate distress. Also assessed for distress and other psychosocial needs.      08/08/2022    3:16 PM  ONCBCN DISTRESS SCREENING  Distress experienced in past week (1-10) 7  Information Concerns Type Lack of info about diagnosis  Referral to support programs Yes   Lysle Morales, counseling intern, met patient and her husband in Breast Cancer clinic. The patient reported they were feeling overwhelmed. The husband responded they felt good about the stage of the diagnosis. The patient reported, today, they feel like they're "drinking out of a fire hose." The patient reported, tonight, they would rest while watching their show on their laptop.   The patient has the counselor's information in case needs arise.   Follow up needed: No.  Lysle Morales,  Counseling Intern  [phone] [email or web address]

## 2022-08-08 NOTE — Progress Notes (Signed)
[facility] CONSULT NOTE  Patient Care Team: Garlan Fillers, MD as PCP - General (Internal Medicine) Ok Edwards, MD (Inactive) as Consulting Physician (Gynecology) Charna Elizabeth, MD as Consulting Physician (Gastroenterology) Rachel Moulds, MD as Consulting Physician (Hematology and Oncology) Griselda Miner, MD as Consulting Physician (General Surgery) Antony Blackbird, MD as Consulting Physician (Radiation Oncology) Pershing Proud, RN as Oncology Nurse Navigator Donnelly Angelica, RN as Oncology Nurse Navigator  CHIEF COMPLAINTS/PURPOSE OF CONSULTATION:  Newly diagnosed breast cancer  HISTORY OF PRESENTING ILLNESS:  Deborah Jones 71 y.o. female is here because of recent diagnosis of left breast cancer  I reviewed her records extensively and collaborated the history with the patient.  SUMMARY OF ONCOLOGIC HISTORY: Oncology History  Ductal carcinoma in situ (DCIS) of left breast  07/11/2022 Mammogram   Bilateral screening mammogram showed possible mass in the left breast warranting further evaluation.  No findings suspicious for malignancy in the right breast.  Diagnostic mammogram confirmed irregular hypoechoic mass in the left breast at 4:00 Axis II centimeters from the nipple measuring 4 mm with internal vascularity corresponding to the mammographic finding.  Ultrasound-guided biopsy recommended to exclude malignancy.   07/20/2022 Breast US   Left breast ultrasound showed left breast mass at 4:00 axis measuring 4 mm corresponding to the mammographic finding.   07/27/2022 Pathology Results   Pathology from the breast biopsy showed DCIS, low-grade involving a papillary lesion. Prognostics showed ER 100% positive strong staining intensity PR 80% positive strong staining intensity   08/06/2022 Initial Diagnosis   Ductal carcinoma in situ (DCIS) of left breast    Patient arrived to the appointment today with her husband.  She is a retired Teacher, early years/pre at  [employer], retired about 5 years ago.  She denies any major medical issues at baseline, does not take any medications except for supplements.  She used birth control for about a year.  No use of hormone replacement therapy.  History of breast cancer mom is alive at 41.  She is nulliparous. Rest of the pertinent 10 point ROS reviewed and negative.  MEDICAL HISTORY:  Past Medical History:  Diagnosis Date   Endometriosis    GERD (gastroesophageal reflux disease)    Hypothyroidism    Kidney stone    Low back pain    STARTED IN JULY 2012   Osteopenia 08/2017   T score -1.3 FRAX 7% / 0.7%   Rosacea     SURGICAL HISTORY: Past Surgical History:  Procedure Laterality Date   ABDOMINAL HYSTERECTOMY     TAH/ WITH BILATERAL SALPINGECTOMIES AND  RIGHT OOPHORECTOMY    EYE SURGERY  08/2019   Cataracts Bilateral eyes   OOPHORECTOMY     RSO   US ECHOCARDIOGRAPHY  04/01/2009   EF 55-60%    SOCIAL HISTORY: Social History   Socioeconomic History   Marital status: Married    Spouse name: Not on file   Number of children: Not on file   Years of education: Not on file   Highest education level: Not on file  Occupational History   Not on file  Tobacco Use   Smoking status: Never   Smokeless tobacco: Never  Vaping Use   Vaping Use: Never used  Substance and Sexual Activity   Alcohol use: No    Alcohol/week: 0.0 standard drinks of alcohol   Drug use: No   Sexual activity: Not Currently    Comment: 1st intercourse 71 yo-Fewer than 5 partners  Other  Topics Concern   Not on file  Social History Narrative   Not on file   Social Determinants of Health   Financial Resource Strain: Not on file  Food Insecurity: Not on file  Transportation Needs: Not on file  Physical Activity: Not on file  Stress: Not on file  Social Connections: Not on file  Intimate Partner Violence: Not on file    FAMILY HISTORY: Family History  Problem Relation Age of Onset   Hypertension Mother     Asthma Mother    Atrial fibrillation Father    Colon cancer Maternal Grandmother        dx 67s   Other Maternal Grandfather        brain tumor; dx 74s    ALLERGIES:  has No Known Allergies.  MEDICATIONS:  Current Outpatient Medications  Medication Sig Dispense Refill   CALCIUM PO Take 1,000 mg by mouth.     Cholecalciferol (VITAMIN D3) 2000 UNITS TABS Take 1,000 Units by mouth daily.      Menatetrenone (VITAMIN K2) 100 MCG TABS 2 capsules Orally once daily     metroNIDAZOLE (METROCREAM) 0.75 % cream Apply topically 2 (two) times daily.     VITAMIN K PO Take 30 mg by mouth daily.     No current facility-administered medications for this visit.    REVIEW OF SYSTEMS:   Constitutional: Denies fevers, chills or abnormal night sweats Eyes: Denies blurriness of vision, double vision or watery eyes Ears, nose, mouth, throat, and face: Denies mucositis or sore throat Respiratory: Denies cough, dyspnea or wheezes Cardiovascular: Denies palpitation, chest discomfort or lower extremity swelling Gastrointestinal:  Denies nausea, heartburn or change in bowel habits Skin: Denies abnormal skin rashes Lymphatics: Denies new lymphadenopathy or easy bruising Neurological:Denies numbness, tingling or new weaknesses Behavioral/Psych: Mood is stable, no new changes  Breast:  Denies any palpable lumps or discharge All other systems were reviewed with the patient and are negative.  PHYSICAL EXAMINATION: ECOG PERFORMANCE STATUS: 0 - Asymptomatic  Vitals:   08/08/22 1247  BP: 115/69  Pulse: 71  Resp: 18  Temp: 98.1 F (36.7 C)  SpO2: 100%   Filed Weights   08/08/22 1247  Weight: 127 lb 14.4 oz (58 kg)    GENERAL:alert, no distress and comfortable NECK: supple, thyroid normal size, non-tender, without nodularity BREAST: Small abnormality at the area of recent biopsy.  No large masses.  No regional adenopathy. Neurological: Masked facies, some unilateral tremor and small steps concerning  for possible early Parkinson's.  LABORATORY DATA:  I have reviewed the data as listed Lab Results  Component Value Date   WBC 5.0 08/08/2022   HGB 12.9 08/08/2022   HCT 37.4 08/08/2022   MCV 93.5 08/08/2022   PLT 183 08/08/2022   Lab Results  Component Value Date   NA 139 08/08/2022   K 4.2 08/08/2022   CL 106 08/08/2022   CO2 28 08/08/2022    RADIOGRAPHIC STUDIES: I have personally reviewed the radiological reports and agreed with the findings in the report.  ASSESSMENT AND PLAN:   Ductal carcinoma in situ (DCIS) of left breast This is a very pleasant 71 year old postmenopausal female patient with newly diagnosed left breast DCIS involving a papillary lesion, low-grade, ER/PR strongly positive referred to breast [facility] for additional recommendations.  We have discussed the following details about DCIS.  Pathology review: We discussed with the patient the difference between DCIS and invasive breast cancer. It is considered a precancerous lesion. DCIS is classified as  a Stage 0 breast cancer. It is generally detected through mammograms as calcifications. We also discussed the importance of ER and PR receptors and their implications to adjuvant treatment options. Prognosis of DCIS dependence on grade and degree of comedo necrosis. It is anticipated that if not treated, 20-30% of DCIS can develop into invasive breast cancer.  Recommendation: 1. Breast conserving surgery 2. Followed by adjuvant radiation therapy 3. Followed by antiestrogen therapy with tamoxifen/aromatase inhibitors based on menopausal status 5 years  Tamoxifen counseling: We discussed the risks and benefits of tamoxifen. These include but not limited to insomnia, hot flashes, mood changes, vaginal dryness, and weight gain. Although rare, serious side effects including endometrial cancer, risk of blood clots were also discussed. We strongly believe that the benefits far outweigh the risks. Patient understands these risks  and consented to starting treatment. Planned treatment duration is 5 years.  Aromatase inhibitors counseling: We have discussed the mechanism of action of aromatase inhibitors today.  We have discussed adverse effects including but not limited to menopausal symptoms, increased risk of osteoporosis and fractures, cardiovascular events, arthralgias and myalgias.  We do believe that the benefits far outweigh the risks.  Plan treatment duration of 5 years.  Given her age closer to 63, Dr. Roselind Messier will review final pathology and see if she will be a candidate for adjuvant radiation.  We have discussed about adjuvant antiestrogen therapy today as mentioned above.  She will return to clinic after surgery to review recommendations once again.  She is agreeable to antiestrogen therapy for 5 years.   Total time spent: 60 minutes including history, physical exam, review of records, counseling and coordination of care All questions were answered. The patient knows to call the clinic with any problems, questions or concerns.    Rachel Moulds, MD 08/09/22

## 2022-08-09 ENCOUNTER — Encounter: Payer: Self-pay | Admitting: Hematology and Oncology

## 2022-08-09 NOTE — Assessment & Plan Note (Signed)
This is a very pleasant 71 year old postmenopausal female patient with newly diagnosed left breast DCIS involving a papillary lesion, low-grade, ER/PR strongly positive referred to breast [facility] for additional recommendations.  We have discussed the following details about DCIS.  Pathology review: We discussed with the patient the difference between DCIS and invasive breast cancer. It is considered a precancerous lesion. DCIS is classified as a Stage 0 breast cancer. It is generally detected through mammograms as calcifications. We also discussed the importance of ER and PR receptors and their implications to adjuvant treatment options. Prognosis of DCIS dependence on grade and degree of comedo necrosis. It is anticipated that if not treated, 20-30% of DCIS can develop into invasive breast cancer.  Recommendation: 1. Breast conserving surgery 2. Followed by adjuvant radiation therapy 3. Followed by antiestrogen therapy with tamoxifen/aromatase inhibitors based on menopausal status 5 years  Tamoxifen counseling: We discussed the risks and benefits of tamoxifen. These include but not limited to insomnia, hot flashes, mood changes, vaginal dryness, and weight gain. Although rare, serious side effects including endometrial cancer, risk of blood clots were also discussed. We strongly believe that the benefits far outweigh the risks. Patient understands these risks and consented to starting treatment. Planned treatment duration is 5 years.  Aromatase inhibitors counseling: We have discussed the mechanism of action of aromatase inhibitors today.  We have discussed adverse effects including but not limited to menopausal symptoms, increased risk of osteoporosis and fractures, cardiovascular events, arthralgias and myalgias.  We do believe that the benefits far outweigh the risks.  Plan treatment duration of 5 years.  Given her age closer to 24, Dr. Roselind Messier will review final pathology and see if she will be a  candidate for adjuvant radiation.  We have discussed about adjuvant antiestrogen therapy today as mentioned above.  She will return to clinic after surgery to review recommendations once again.  She is agreeable to antiestrogen therapy for 5 years.

## 2022-08-10 ENCOUNTER — Other Ambulatory Visit: Payer: Self-pay | Admitting: General Surgery

## 2022-08-10 ENCOUNTER — Other Ambulatory Visit: Payer: Self-pay | Admitting: *Deleted

## 2022-08-10 DIAGNOSIS — D0512 Intraductal carcinoma in situ of left breast: Secondary | ICD-10-CM

## 2022-08-13 ENCOUNTER — Telehealth: Payer: Self-pay | Admitting: Hematology and Oncology

## 2022-08-13 NOTE — Telephone Encounter (Signed)
Called patient per 11/10 in basket. Spoke with spouse to get f/u appointment scheduled.

## 2022-08-16 ENCOUNTER — Telehealth: Payer: Self-pay | Admitting: *Deleted

## 2022-08-16 ENCOUNTER — Encounter: Payer: Self-pay | Admitting: *Deleted

## 2022-08-16 NOTE — Telephone Encounter (Signed)
Pt has decided to forgo xrt at this time. Request appt with Dr. Roselind Messier to be cx Pt will move forward with sx and anti-estrogen oral therapy. Confirmed future appts

## 2022-08-16 NOTE — Telephone Encounter (Signed)
Spoke with patient to follow up from [facility] and assess navigation needs. Patient denies any questions or concerns at this time. Encouraged her to call should anything arise.Patient verbalized understanding.  

## 2022-08-27 ENCOUNTER — Encounter (HOSPITAL_BASED_OUTPATIENT_CLINIC_OR_DEPARTMENT_OTHER): Payer: Self-pay | Admitting: General Surgery

## 2022-08-29 ENCOUNTER — Telehealth: Payer: Self-pay | Admitting: Hematology and Oncology

## 2022-08-29 ENCOUNTER — Encounter (HOSPITAL_BASED_OUTPATIENT_CLINIC_OR_DEPARTMENT_OTHER)
Admission: RE | Admit: 2022-08-29 | Discharge: 2022-08-29 | Disposition: A | Discharge status: Home / Self Care | Payer: PPO | Source: Ambulatory Visit | Attending: General Surgery | Admitting: General Surgery

## 2022-08-29 ENCOUNTER — Ambulatory Visit
Admission: RE | Admit: 2022-08-29 | Discharge: 2022-08-29 | Disposition: A | Discharge status: Home / Self Care | Payer: PPO | Source: Ambulatory Visit | Attending: General Surgery | Admitting: General Surgery

## 2022-08-29 DIAGNOSIS — D0512 Intraductal carcinoma in situ of left breast: Secondary | ICD-10-CM | POA: Diagnosis not present

## 2022-08-29 HISTORY — PX: BREAST BIOPSY: SHX20

## 2022-08-29 NOTE — Anesthesia Preprocedure Evaluation (Signed)
Anesthesia Evaluation  Patient identified by MRN, date of birth, ID band Patient awake    Reviewed: Allergy & Precautions, NPO status , Patient's Chart, lab work & pertinent test results  Airway Mallampati: II  TM Distance: >3 FB Neck ROM: Full    Dental no notable dental hx. (+) Teeth Intact, Dental Advisory Given   Pulmonary neg pulmonary ROS   Pulmonary exam normal breath sounds clear to auscultation       Cardiovascular Normal cardiovascular exam Rhythm:Regular Rate:Normal     Neuro/Psych negative neurological ROS     GI/Hepatic   Endo/Other    Renal/GU Lab Results      Component                Value               Date                      CREATININE               0.80                08/08/2022                BUN                      24 (H)              08/08/2022                NA                       139                 08/08/2022                K                        4.2                 08/08/2022                CL                       106                 08/08/2022                CO2                      28                  08/08/2022                Musculoskeletal   Abdominal   Peds  Hematology   Anesthesia Other Findings L breast CA  Reproductive/Obstetrics                             Anesthesia Physical Anesthesia Plan  ASA: 2  Anesthesia Plan: General   Post-op Pain Management: Precedex and Ofirmev IV (intra-op)*   Induction: Intravenous  PONV Risk Score and Plan: TIVA, Propofol infusion, Treatment may vary due to age or medical condition, Midazolam and Ondansetron  Airway Management Planned: LMA  Additional Equipment: None  Intra-op Plan:   Post-operative Plan:  Informed Consent: I have reviewed the patients History and Physical, chart, labs and discussed the procedure including the risks, benefits and alternatives for the proposed anesthesia with the  patient or authorized representative who has indicated his/her understanding and acceptance.     Dental advisory given  Plan Discussed with: CRNA, Anesthesiologist and Surgeon  Anesthesia Plan Comments: (TIVA LMA)        Anesthesia Quick Evaluation

## 2022-08-29 NOTE — Progress Notes (Signed)
Patient given presurgery drink with written and verbal instruction. Patient given CHG soap with written and verbal instuction. Patient verbalized understanding.

## 2022-08-29 NOTE — Telephone Encounter (Signed)
Called patient to r/s dec. Clinic visit. Left voicemail with new appointment information.

## 2022-08-30 ENCOUNTER — Encounter (HOSPITAL_BASED_OUTPATIENT_CLINIC_OR_DEPARTMENT_OTHER)
Admission: RE | Disposition: A | Discharge status: Home / Self Care | Payer: Self-pay | Source: Home / Self Care | Attending: General Surgery

## 2022-08-30 ENCOUNTER — Ambulatory Visit (HOSPITAL_BASED_OUTPATIENT_CLINIC_OR_DEPARTMENT_OTHER): Payer: PPO | Admitting: Anesthesiology

## 2022-08-30 ENCOUNTER — Other Ambulatory Visit: Payer: Self-pay

## 2022-08-30 ENCOUNTER — Ambulatory Visit
Admission: RE | Admit: 2022-08-30 | Discharge: 2022-08-30 | Disposition: A | Discharge status: Home / Self Care | Payer: PPO | Source: Ambulatory Visit | Attending: General Surgery | Admitting: General Surgery

## 2022-08-30 ENCOUNTER — Encounter (HOSPITAL_BASED_OUTPATIENT_CLINIC_OR_DEPARTMENT_OTHER): Payer: Self-pay | Admitting: General Surgery

## 2022-08-30 ENCOUNTER — Ambulatory Visit (HOSPITAL_BASED_OUTPATIENT_CLINIC_OR_DEPARTMENT_OTHER)
Admission: RE | Admit: 2022-08-30 | Discharge: 2022-08-30 | Disposition: A | Discharge status: Home / Self Care | Payer: PPO | Attending: General Surgery | Admitting: General Surgery

## 2022-08-30 DIAGNOSIS — D0512 Intraductal carcinoma in situ of left breast: Secondary | ICD-10-CM

## 2022-08-30 DIAGNOSIS — N6012 Diffuse cystic mastopathy of left breast: Secondary | ICD-10-CM | POA: Diagnosis not present

## 2022-08-30 DIAGNOSIS — N6082 Other benign mammary dysplasias of left breast: Secondary | ICD-10-CM | POA: Diagnosis not present

## 2022-08-30 DIAGNOSIS — Z9012 Acquired absence of left breast and nipple: Secondary | ICD-10-CM | POA: Diagnosis not present

## 2022-08-30 DIAGNOSIS — N62 Hypertrophy of breast: Secondary | ICD-10-CM | POA: Diagnosis not present

## 2022-08-30 HISTORY — PX: BREAST LUMPECTOMY: SHX2

## 2022-08-30 HISTORY — PX: BREAST LUMPECTOMY WITH RADIOACTIVE SEED LOCALIZATION: SHX6424

## 2022-08-30 SURGERY — BREAST LUMPECTOMY WITH RADIOACTIVE SEED LOCALIZATION
Anesthesia: General | Site: Breast | Laterality: Left

## 2022-08-30 MED ORDER — BUPIVACAINE-EPINEPHRINE (PF) 0.25% -1:200000 IJ SOLN
INTRAMUSCULAR | Status: DC | PRN
  Administered 2022-08-30: 20 mL

## 2022-08-30 MED ORDER — ACETAMINOPHEN 500 MG PO TABS
ORAL_TABLET | ORAL | Status: AC
  Filled 2022-08-30: qty 2

## 2022-08-30 MED ORDER — EPHEDRINE SULFATE (PRESSORS) 50 MG/ML IJ SOLN
INTRAMUSCULAR | Status: DC | PRN
  Administered 2022-08-30: 20 mg via INTRAVENOUS

## 2022-08-30 MED ORDER — LACTATED RINGERS IV SOLN: INTRAVENOUS | Status: DC

## 2022-08-30 MED ORDER — ONDANSETRON HCL 4 MG/2ML IJ SOLN: 4.0000 mg | Freq: Once | INTRAMUSCULAR | Status: DC | PRN

## 2022-08-30 MED ORDER — DEXMEDETOMIDINE HCL IN NACL 80 MCG/20ML IV SOLN
INTRAVENOUS | Status: DC | PRN
  Administered 2022-08-30: 12 ug via BUCCAL

## 2022-08-30 MED ORDER — ACETAMINOPHEN 500 MG PO TABS
1000.0000 mg | ORAL_TABLET | ORAL | Status: AC
  Administered 2022-08-30: 1000 mg via ORAL

## 2022-08-30 MED ORDER — GABAPENTIN 300 MG PO CAPS
300.0000 mg | ORAL_CAPSULE | ORAL | Status: AC
  Administered 2022-08-30: 300 mg via ORAL

## 2022-08-30 MED ORDER — FENTANYL CITRATE (PF) 100 MCG/2ML IJ SOLN: 25.0000 ug | INTRAMUSCULAR | Status: DC | PRN

## 2022-08-30 MED ORDER — ONDANSETRON HCL 4 MG/2ML IJ SOLN
INTRAMUSCULAR | Status: DC | PRN
  Administered 2022-08-30: 4 mg via INTRAVENOUS

## 2022-08-30 MED ORDER — OXYCODONE HCL 5 MG PO TABS: 5.0000 mg | ORAL_TABLET | Freq: Four times a day (QID) | ORAL | 0 refills | Status: DC | PRN

## 2022-08-30 MED ORDER — FENTANYL CITRATE (PF) 100 MCG/2ML IJ SOLN
INTRAMUSCULAR | Status: AC
  Filled 2022-08-30: qty 2

## 2022-08-30 MED ORDER — ACETAMINOPHEN 10 MG/ML IV SOLN: 1000.0000 mg | Freq: Once | INTRAVENOUS | Status: DC | PRN

## 2022-08-30 MED ORDER — CELECOXIB 200 MG PO CAPS
ORAL_CAPSULE | ORAL | Status: AC
  Filled 2022-08-30: qty 1

## 2022-08-30 MED ORDER — CELECOXIB 200 MG PO CAPS
200.0000 mg | ORAL_CAPSULE | ORAL | Status: AC
  Administered 2022-08-30: 200 mg via ORAL

## 2022-08-30 MED ORDER — FENTANYL CITRATE (PF) 100 MCG/2ML IJ SOLN
INTRAMUSCULAR | Status: DC | PRN
  Administered 2022-08-30: 100 ug via INTRAVENOUS

## 2022-08-30 MED ORDER — PROPOFOL 500 MG/50ML IV EMUL
INTRAVENOUS | Status: DC | PRN
  Administered 2022-08-30: 150 ug/kg/min via INTRAVENOUS

## 2022-08-30 MED ORDER — CEFAZOLIN SODIUM-DEXTROSE 2-4 GM/100ML-% IV SOLN
INTRAVENOUS | Status: AC
  Filled 2022-08-30: qty 100

## 2022-08-30 MED ORDER — CEFAZOLIN SODIUM-DEXTROSE 2-4 GM/100ML-% IV SOLN
2.0000 g | INTRAVENOUS | Status: AC
  Administered 2022-08-30: 2 g via INTRAVENOUS

## 2022-08-30 MED ORDER — PROPOFOL 10 MG/ML IV BOLUS
INTRAVENOUS | Status: DC | PRN
  Administered 2022-08-30: 100 mg via INTRAVENOUS

## 2022-08-30 MED ORDER — LIDOCAINE HCL (CARDIAC) PF 100 MG/5ML IV SOSY
PREFILLED_SYRINGE | INTRAVENOUS | Status: DC | PRN
  Administered 2022-08-30: 100 mg via INTRAVENOUS

## 2022-08-30 MED ORDER — GABAPENTIN 300 MG PO CAPS
ORAL_CAPSULE | ORAL | Status: AC
  Filled 2022-08-30: qty 1

## 2022-08-30 MED ORDER — CHLORHEXIDINE GLUCONATE CLOTH 2 % EX PADS: 6.0000 | MEDICATED_PAD | Freq: Once | CUTANEOUS | Status: DC

## 2022-08-30 MED ORDER — MIDAZOLAM HCL 2 MG/2ML IJ SOLN
INTRAMUSCULAR | Status: AC
  Filled 2022-08-30: qty 2

## 2022-08-30 MED ORDER — DEXAMETHASONE SODIUM PHOSPHATE 4 MG/ML IJ SOLN
INTRAMUSCULAR | Status: DC | PRN
  Administered 2022-08-30: 4 mg via INTRAVENOUS

## 2022-08-30 MED ORDER — MIDAZOLAM HCL 5 MG/5ML IJ SOLN
INTRAMUSCULAR | Status: DC | PRN
  Administered 2022-08-30: 1 mg via INTRAVENOUS

## 2022-08-30 SURGICAL SUPPLY — 42 items
ADH SKN CLS APL DERMABOND .7 (GAUZE/BANDAGES/DRESSINGS) ×1
APL PRP STRL LF DISP 70% ISPRP (MISCELLANEOUS) ×1
APPLIER CLIP 9.375 MED OPEN (MISCELLANEOUS)
APR CLP MED 9.3 20 MLT OPN (MISCELLANEOUS)
BINDER BREAST LRG (GAUZE/BANDAGES/DRESSINGS) IMPLANT
BLADE SURG 15 STRL LF DISP TIS (BLADE) ×1 IMPLANT
BLADE SURG 15 STRL SS (BLADE) ×1
CANISTER SUC SOCK COL 7IN (MISCELLANEOUS) ×1 IMPLANT
CANISTER SUCT 1200ML W/VALVE (MISCELLANEOUS) ×1 IMPLANT
CHLORAPREP W/TINT 26 (MISCELLANEOUS) ×1 IMPLANT
CLIP APPLIE 9.375 MED OPEN (MISCELLANEOUS) IMPLANT
COVER BACK TABLE 60X90IN (DRAPES) ×1 IMPLANT
COVER MAYO STAND STRL (DRAPES) ×1 IMPLANT
COVER PROBE CYLINDRICAL 5X96 (MISCELLANEOUS) ×1 IMPLANT
DERMABOND ADVANCED .7 DNX12 (GAUZE/BANDAGES/DRESSINGS) ×1 IMPLANT
DRAPE LAPAROSCOPIC ABDOMINAL (DRAPES) ×1 IMPLANT
DRAPE UTILITY XL STRL (DRAPES) ×1 IMPLANT
ELECT COATED BLADE 2.86 ST (ELECTRODE) ×1 IMPLANT
ELECT REM PT RETURN 9FT ADLT (ELECTROSURGICAL) ×1
ELECTRODE REM PT RTRN 9FT ADLT (ELECTROSURGICAL) ×1 IMPLANT
GLOVE BIO SURGEON STRL SZ 6.5 (GLOVE) IMPLANT
GLOVE BIO SURGEON STRL SZ7.5 (GLOVE) ×2 IMPLANT
GLOVE BIOGEL PI IND STRL 6.5 (GLOVE) IMPLANT
GLOVE SS BIOGEL STRL SZ 6.5 (GLOVE) IMPLANT
GOWN STRL REUS W/ TWL LRG LVL3 (GOWN DISPOSABLE) ×2 IMPLANT
GOWN STRL REUS W/TWL LRG LVL3 (GOWN DISPOSABLE) ×3
KIT MARKER MARGIN INK (KITS) ×1 IMPLANT
NDL HYPO 25X1 1.5 SAFETY (NEEDLE) IMPLANT
NEEDLE HYPO 25X1 1.5 SAFETY (NEEDLE) ×1 IMPLANT
NS IRRIG 1000ML POUR BTL (IV SOLUTION) IMPLANT
PACK BASIN DAY SURGERY FS (CUSTOM PROCEDURE TRAY) ×1 IMPLANT
PENCIL SMOKE EVACUATOR (MISCELLANEOUS) ×1 IMPLANT
SLEEVE SCD COMPRESS KNEE MED (STOCKING) ×1 IMPLANT
SPONGE T-LAP 18X18 ~~LOC~~+RFID (SPONGE) ×1 IMPLANT
SUT MON AB 4-0 PC3 18 (SUTURE) ×1 IMPLANT
SUT SILK 2 0 SH (SUTURE) IMPLANT
SUT VICRYL 3-0 CR8 SH (SUTURE) ×1 IMPLANT
SYR CONTROL 10ML LL (SYRINGE) IMPLANT
TOWEL GREEN STERILE FF (TOWEL DISPOSABLE) ×1 IMPLANT
TRAY FAXITRON CT DISP (TRAY / TRAY PROCEDURE) ×1 IMPLANT
TUBE CONNECTING 20X1/4 (TUBING) ×1 IMPLANT
YANKAUER SUCT BULB TIP NO VENT (SUCTIONS) IMPLANT

## 2022-08-30 NOTE — Anesthesia Postprocedure Evaluation (Signed)
Anesthesia Post Note  Patient: Deborah Jones  Procedure(s) Performed: LEFT BREAST LUMPECTOMY WITH RADIOACTIVE SEED LOCALIZATION (Left: Breast)     Patient location during evaluation: PACU Anesthesia Type: General Level of consciousness: awake and alert Pain management: pain level controlled Vital Signs Assessment: post-procedure vital signs reviewed and stable Respiratory status: spontaneous breathing, nonlabored ventilation, respiratory function stable and patient connected to nasal cannula oxygen Cardiovascular status: blood pressure returned to baseline and stable Postop Assessment: no apparent nausea or vomiting Anesthetic complications: no  No notable events documented.  Last Vitals:  Vitals:   08/30/22 1415 08/30/22 1445  BP: (!) 103/56 106/66  Pulse: 75 79  Resp: (!) 9 18  Temp:  (!) 36.4 C  SpO2: 100% 95%    Last Pain:  Vitals:   08/30/22 1445  TempSrc:   PainSc: 0-No pain                 Trevor Iha

## 2022-08-30 NOTE — Anesthesia Procedure Notes (Signed)
Procedure Name: LMA Insertion Date/Time: 08/30/2022 1:25 PM  Performed by: Lance Coon, CRNAPre-anesthesia Checklist: Patient identified, Emergency Drugs available, Suction available and Patient being monitored Patient Re-evaluated:Patient Re-evaluated prior to induction Oxygen Delivery Method: Circle system utilized Preoxygenation: Pre-oxygenation with 100% oxygen Induction Type: IV induction Ventilation: Mask ventilation without difficulty LMA: LMA inserted LMA Size: 4.0 Number of attempts: 1 Airway Equipment and Method: Bite block Placement Confirmation: positive ETCO2 Tube secured with: Tape Dental Injury: Teeth and Oropharynx as per pre-operative assessment

## 2022-08-30 NOTE — H&P (Signed)
REFERRING PHYSICIAN: Lucia Bitter*  PROVIDER: Lindell Noe, MD  MRN: [id number] DOB: 1951-07-12 Subjective   Chief Complaint: Breast Cancer   History of Present Illness: Deborah Jones is a 71 y.o. female who is seen today as an office consultation for evaluation of Breast Cancer .   We are asked to see the patient in consultation by Dr. Al Pimple to evaluate her for a new left breast cancer. The patient is a 71 year old white female who recently went for a routine screening mammogram. At that time she was found to have a 4 mm mass in the lower outer quadrant of the left breast. The axilla looked normal. The mass was biopsied and came back as a low-grade DCIS that was ER and PR positive. She is otherwise in good health and does not smoke. She is a retired Teacher, early years/pre from [employer]  Review of Systems: A complete review of systems was obtained from the patient. I have reviewed this information and discussed as appropriate with the patient. See HPI as well for other ROS.  Review of Systems  Constitutional: Negative.  HENT: Negative.  Eyes: Negative.  Respiratory: Negative.  Cardiovascular: Negative.  Gastrointestinal: Negative.  Genitourinary: Negative.  Musculoskeletal: Negative.  Skin: Negative.  Neurological: Negative.  Endo/Heme/Allergies: Negative.  Psychiatric/Behavioral: Negative.    Medical History: Past Medical History:  Diagnosis Date  GERD (gastroesophageal reflux disease)  History of cancer   Patient Active Problem List  Diagnosis  Ductal carcinoma in situ (DCIS) of left breast   Past Surgical History:  Procedure Laterality Date  HYSTERECTOMY N/A  Abdominal , also Oophorectomy, dates unknown    No Known Allergies  Current Outpatient Medications on File Prior to Visit  Medication Sig Dispense Refill  calcium carbonate 500 mg calcium (1,250 mg) chewable tablet Take 1,000 mg of elemental by mouth once daily  cholecalciferol (VITAMIN D3) 2,000  unit tablet Take 2,000 Units by mouth once daily   No current facility-administered medications on file prior to visit.   Family History  Problem Relation Age of Onset  Asthma Mother  High blood pressure (Hypertension) Mother  Atrial fibrillation (Abnormal heart rhythm sometimes requiring treatment with blood thinners) Father  Colon cancer Maternal Grandmother  Brain cancer Maternal Grandfather    Social History   Tobacco Use  Smoking Status Never  Smokeless Tobacco Never    Social History   Socioeconomic History  Marital status: Married  Tobacco Use  Smoking status: Never  Smokeless tobacco: Never  Vaping Use  Vaping Use: Never used  Substance and Sexual Activity  Alcohol use: Never  Drug use: Never   Objective:  There were no vitals filed for this visit.  There is no height or weight on file to calculate BMI.  Physical Exam Vitals reviewed.  Constitutional:  General: She is not in acute distress. Appearance: Normal appearance.  HENT:  Head: Normocephalic and atraumatic.  Right Ear: External ear normal.  Left Ear: External ear normal.  Nose: Nose normal.  Mouth/Throat:  Mouth: Mucous membranes are moist.  Pharynx: Oropharynx is clear.  Eyes:  General: No scleral icterus. Extraocular Movements: Extraocular movements intact.  Conjunctiva/sclera: Conjunctivae normal.  Pupils: Pupils are equal, round, and reactive to light.  Cardiovascular:  Rate and Rhythm: Normal rate and regular rhythm.  Pulses: Normal pulses.  Heart sounds: Normal heart sounds.  Pulmonary:  Effort: Pulmonary effort is normal. No respiratory distress.  Breath sounds: Normal breath sounds.  Abdominal:  General: Bowel sounds are normal.  Palpations: Abdomen  is soft.  Tenderness: There is no abdominal tenderness.  Musculoskeletal:  General: No swelling, tenderness or deformity. Normal range of motion.  Cervical back: Normal range of motion and neck supple.  Skin: General: Skin is  warm and dry.  Coloration: Skin is not jaundiced.  Neurological:  General: No focal deficit present.  Mental Status: She is alert and oriented to person, place, and time.  Psychiatric:  Mood and Affect: Mood normal.  Behavior: Behavior normal.     Breast: There is no palpable mass in either breast. There is no palpable axillary, supraclavicular, or cervical lymphadenopathy.  Labs, Imaging and Diagnostic Testing:  Assessment and Plan:   Diagnoses and all orders for this visit:  Ductal carcinoma in situ (DCIS) of left breast - [facility] Case Posting Request; Future    The patient appears to have a 4 mm area of ductal carcinoma in situ in the lower outer quadrant of the left breast. I have discussed with her in detail the different options for treatment and at this point she favors breast conservation which I feel is very reasonable. I have discussed with her in detail the risks and benefits of the operation as well as some of the technical aspects including the use of a radioactive seed for localization and she understands and wishes to proceed. She will also meet today with medical and radiation oncology to discuss adjuvant therapy. We will begin surgical planning.

## 2022-08-30 NOTE — Interval H&P Note (Signed)
History and Physical Interval Note:  08/30/2022 12:50 PM  Deborah Jones  has presented today for surgery, with the diagnosis of LEFT BREAST DCIS.  The various methods of treatment have been discussed with the patient and family. After consideration of risks, benefits and other options for treatment, the patient has consented to  Procedure(s): LEFT BREAST LUMPECTOMY WITH RADIOACTIVE SEED LOCALIZATION (Left) as a surgical intervention.  The patient's history has been reviewed, patient examined, no change in status, stable for surgery.  I have reviewed the patient's chart and labs.  Questions were answered to the patient's satisfaction.     Chevis Pretty III

## 2022-08-30 NOTE — Transfer of Care (Signed)
Immediate Anesthesia Transfer of Care Note  Patient: Deborah Jones  Procedure(s) Performed: LEFT BREAST LUMPECTOMY WITH RADIOACTIVE SEED LOCALIZATION (Left: Breast)  Patient Location: PACU  Anesthesia Type:General  Level of Consciousness: sedated  Airway & Oxygen Therapy: Patient Spontanous Breathing and Patient connected to face mask oxygen  Post-op Assessment: Report given to RN and Post -op Vital signs reviewed and stable  Post vital signs: Reviewed and stable  Last Vitals:  Vitals Value Taken Time  BP 96/54 08/30/22 1400  Temp    Pulse 71 08/30/22 1402  Resp 10 08/30/22 1402  SpO2 98 % 08/30/22 1402  Vitals shown include unvalidated device data.  Last Pain:  Vitals:   08/30/22 1223  TempSrc: Oral  PainSc: 0-No pain      Patients Stated Pain Goal: 4 (08/30/22 1223)  Complications: No notable events documented.

## 2022-08-30 NOTE — Discharge Instructions (Signed)
  Post Anesthesia Home Care Instructions  Activity: Get plenty of rest for the remainder of the day. A responsible individual must stay with you for 24 hours following the procedure.  For the next 24 hours, DO NOT: -Drive a car -Advertising copywriter -Drink alcoholic beverages -Take any medication unless instructed by your physician -Make any legal decisions or sign important papers.  Meals: Start with liquid foods such as gelatin or soup. Progress to regular foods as tolerated. Avoid greasy, spicy, heavy foods. If nausea and/or vomiting occur, drink only clear liquids until the nausea and/or vomiting subsides. Call your physician if vomiting continues.  Special Instructions/Symptoms: Your throat may feel dry or sore from the anesthesia or the breathing tube placed in your throat during surgery. If this causes discomfort, gargle with warm salt water. The discomfort should disappear within 24 hours.  If you had a scopolamine patch placed behind your ear for the management of post- operative nausea and/or vomiting:  1. The medication in the patch is effective for 72 hours, after which it should be removed.  Wrap patch in a tissue and discard in the trash. Wash hands thoroughly with soap and water. 2. You may remove the patch earlier than 72 hours if you experience unpleasant side effects which may include dry mouth, dizziness or visual disturbances. 3. Avoid touching the patch. Wash your hands with soap and water after contact with the patch.  No tylenol or ibuprofen until after 6:35pm today if needed.

## 2022-08-30 NOTE — Op Note (Signed)
08/30/2022  1:51 PM  PATIENT:  Deborah Jones  71 y.o. female  PRE-OPERATIVE DIAGNOSIS:  LEFT BREAST DCIS  POST-OPERATIVE DIAGNOSIS:  LEFT BREAST DCIS  PROCEDURE:  Procedure(s): LEFT BREAST LUMPECTOMY WITH RADIOACTIVE SEED LOCALIZATION (Left)  SURGEON:  Surgeon(s) and Role:    Griselda Miner, MD - Primary  PHYSICIAN ASSISTANT:   ASSISTANTS: none   ANESTHESIA:   local and general  EBL:  10 mL   BLOOD ADMINISTERED:none  DRAINS: none   LOCAL MEDICATIONS USED:  MARCAINE     SPECIMEN:  Source of Specimen:  left breat tissue with additional medial and superior margin  DISPOSITION OF SPECIMEN:  PATHOLOGY  COUNTS:  YES  TOURNIQUET:  * No tourniquets in log *  DICTATION: .Dragon Dictation  After informed consent was obtained the patient was brought to the operating room and placed in the supine position on the operating table.  After adequate induction of general anesthesia the patient's left breast was prepped with ChloraPrep, allowed to dry, and draped in usual sterile manner.  An appropriate timeout was performed.  Previously an I-125 seed was placed in the lower outer left breast to mark an area of ductal carcinoma in situ.  The neoprobe was set to I-125 in the area of radioactivity was readily identified.  The area around this was infiltrated with quarter percent Marcaine.  A curvilinear incision was made along the lower outer edge of the areola with a 15 blade knife.  The incision was carried through the skin and subcutaneous tissue sharply with the electrocautery.  Dissection was then carried throughout the lower outer quadrant between the breast tissue and the subcutaneous fat and skin.  The skin flap was fairly thin overlying the area of the cancer.  Once the dissection was well beyond the area of radioactivity I then removed the circular portion of breast tissue sharply with the electrocautery around the radioactive seed while checking the area of radioactivity  frequently.  Once the specimen was removed it was oriented with the appropriate paint colors.  A specimen radiograph was obtained that showed the clip and seed to be near the center of the specimen.  I did elect to take an additional medial and superior margin and this tissue was marked appropriately.  All of the tissue was then sent to pathology for further evaluation.  Hemostasis was achieved using the Bovie electrocautery.  The wound was irrigated with saline and infiltrated with more quarter percent Marcaine.  The deep layer of the wound was then closed with interrupted 3-0 Vicryl stitches.  The skin was then closed with interrupted 4-0 Monocryl subcuticular stitches.  Dermabond dressings were applied.  The patient tolerated the procedure well.  At the end of the case all needle sponge and instrument counts were correct.  The patient was then awakened and taken to recovery in stable condition.  PLAN OF CARE: Discharge to home after PACU  PATIENT DISPOSITION:  PACU - hemodynamically stable.   Delay start of Pharmacological VTE agent (>24hrs) due to surgical blood loss or risk of bleeding: not applicable

## 2022-08-31 ENCOUNTER — Encounter (HOSPITAL_BASED_OUTPATIENT_CLINIC_OR_DEPARTMENT_OTHER): Payer: Self-pay | Admitting: General Surgery

## 2022-09-03 DIAGNOSIS — C50912 Malignant neoplasm of unspecified site of left female breast: Secondary | ICD-10-CM | POA: Diagnosis not present

## 2022-09-04 LAB — SURGICAL PATHOLOGY

## 2022-09-05 ENCOUNTER — Encounter: Payer: Self-pay | Admitting: *Deleted

## 2022-09-12 ENCOUNTER — Ambulatory Visit: Payer: PPO | Admitting: Hematology and Oncology

## 2022-09-12 ENCOUNTER — Inpatient Hospital Stay: Payer: PPO | Attending: Hematology and Oncology | Admitting: Hematology and Oncology

## 2022-09-12 VITALS — BP 108/64 | HR 72 | Temp 97.9°F | Resp 16 | Ht 66.0 in | Wt 125.6 lb

## 2022-09-12 DIAGNOSIS — Z9079 Acquired absence of other genital organ(s): Secondary | ICD-10-CM | POA: Diagnosis not present

## 2022-09-12 DIAGNOSIS — Z90721 Acquired absence of ovaries, unilateral: Secondary | ICD-10-CM | POA: Diagnosis not present

## 2022-09-12 DIAGNOSIS — Z90710 Acquired absence of both cervix and uterus: Secondary | ICD-10-CM | POA: Insufficient documentation

## 2022-09-12 DIAGNOSIS — D0512 Intraductal carcinoma in situ of left breast: Secondary | ICD-10-CM

## 2022-09-12 MED ORDER — TAMOXIFEN CITRATE 20 MG PO TABS: 20.0000 mg | ORAL_TABLET | Freq: Every day | ORAL | 3 refills | Status: DC

## 2022-09-12 NOTE — Progress Notes (Signed)
[facility] CONSULT NOTE  Patient Care Team: Garlan Fillers, MD as PCP - General (Internal Medicine) Ok Edwards, MD (Inactive) as Consulting Physician (Gynecology) Charna Elizabeth, MD as Consulting Physician (Gastroenterology) Rachel Moulds, MD as Consulting Physician (Hematology and Oncology) Griselda Miner, MD as Consulting Physician (General Surgery) Antony Blackbird, MD as Consulting Physician (Radiation Oncology) Pershing Proud, RN as Oncology Nurse Navigator Donnelly Angelica, RN as Oncology Nurse Navigator  CHIEF COMPLAINTS/PURPOSE OF CONSULTATION:  Newly diagnosed breast cancer  HISTORY OF PRESENTING ILLNESS:  Deborah Jones 71 y.o. female is here because of recent diagnosis of left breast cancer  I reviewed her records extensively and collaborated the history with the patient.  SUMMARY OF ONCOLOGIC HISTORY: Oncology History  Ductal carcinoma in situ (DCIS) of left breast  07/11/2022 Mammogram   Bilateral screening mammogram showed possible mass in the left breast warranting further evaluation.  No findings suspicious for malignancy in the right breast.  Diagnostic mammogram confirmed irregular hypoechoic mass in the left breast at 4:00 Axis II centimeters from the nipple measuring 4 mm with internal vascularity corresponding to the mammographic finding.  Ultrasound-guided biopsy recommended to exclude malignancy.   07/20/2022 Breast US   Left breast ultrasound showed left breast mass at 4:00 axis measuring 4 mm corresponding to the mammographic finding.   07/27/2022 Pathology Results   Pathology from the breast biopsy showed DCIS, low-grade involving a papillary lesion. Prognostics showed ER 100% positive strong staining intensity PR 80% positive strong staining intensity   08/06/2022 Initial Diagnosis   Ductal carcinoma in situ (DCIS) of left breast    Patient arrived to the appointment today with her husband.  She is a retired Teacher, early years/pre at  [employer], retired about 5 years ago.  She denies any major medical issues at baseline, does not take any medications except for supplements.  She used birth control for about a year.  No use of hormone replacement therapy.  History of breast cancer mom is alive at 20.  She is nulliparous. Rest of the pertinent 10 point ROS reviewed and negative.  MEDICAL HISTORY:  Past Medical History:  Diagnosis Date   Endometriosis    GERD (gastroesophageal reflux disease)    Hypothyroidism    Kidney stone    Low back pain    STARTED IN JULY 2012   Osteopenia 08/2017   T score -1.3 FRAX 7% / 0.7%   Rosacea     SURGICAL HISTORY: Past Surgical History:  Procedure Laterality Date   ABDOMINAL HYSTERECTOMY     TAH/ WITH BILATERAL SALPINGECTOMIES AND  RIGHT OOPHORECTOMY    BREAST BIOPSY  08/29/2022   MM LT RADIOACTIVE SEED LOC MAMMO GUIDE 08/29/2022 [facility]   BREAST LUMPECTOMY WITH RADIOACTIVE SEED LOCALIZATION Left 08/30/2022   Procedure: LEFT BREAST LUMPECTOMY WITH RADIOACTIVE SEED LOCALIZATION;  Surgeon: Griselda Miner, MD;  Location: [facility];  Service: General;  Laterality: Left;   EYE SURGERY  08/2019   Cataracts Bilateral eyes   OOPHORECTOMY     RSO   US ECHOCARDIOGRAPHY  04/01/2009   EF 55-60%    SOCIAL HISTORY: Social History   Socioeconomic History   Marital status: Married    Spouse name: Not on file   Number of children: Not on file   Years of education: Not on file   Highest education level: Not on file  Occupational History   Not on file  Tobacco Use   Smoking status: Never  Smokeless tobacco: Never  Vaping Use   Vaping Use: Never used  Substance and Sexual Activity   Alcohol use: No    Alcohol/week: 0.0 standard drinks of alcohol   Drug use: No   Sexual activity: Not Currently    Comment: 1st intercourse 71 yo-Fewer than 5 partners  Other Topics Concern   Not on file  Social History Narrative   Not on file   Social  Determinants of Health   Financial Resource Strain: Not on file  Food Insecurity: Not on file  Transportation Needs: Not on file  Physical Activity: Not on file  Stress: Not on file  Social Connections: Not on file  Intimate Partner Violence: Not on file    FAMILY HISTORY: Family History  Problem Relation Age of Onset   Hypertension Mother    Asthma Mother    Atrial fibrillation Father    Colon cancer Maternal Grandmother        dx 49s   Other Maternal Grandfather        brain tumor; dx 4s    ALLERGIES:  has No Known Allergies.  MEDICATIONS:  Current Outpatient Medications  Medication Sig Dispense Refill   CALCIUM PO Take 1,000 mg by mouth.     Cholecalciferol (VITAMIN D3) 2000 UNITS TABS Take 1,000 Units by mouth daily.      Menatetrenone (VITAMIN K2) 100 MCG TABS 2 capsules Orally once daily     metroNIDAZOLE (METROCREAM) 0.75 % cream Apply topically 2 (two) times daily.     oxyCODONE (ROXICODONE) 5 MG immediate release tablet Take 1 tablet (5 mg total) by mouth every 6 (six) hours as needed for severe pain. 10 tablet 0   No current facility-administered medications for this visit.    REVIEW OF SYSTEMS:   Constitutional: Denies fevers, chills or abnormal night sweats Eyes: Denies blurriness of vision, double vision or watery eyes Ears, nose, mouth, throat, and face: Denies mucositis or sore throat Respiratory: Denies cough, dyspnea or wheezes Cardiovascular: Denies palpitation, chest discomfort or lower extremity swelling Gastrointestinal:  Denies nausea, heartburn or change in bowel habits Skin: Denies abnormal skin rashes Lymphatics: Denies new lymphadenopathy or easy bruising Neurological:Denies numbness, tingling or new weaknesses Behavioral/Psych: Mood is stable, no new changes  Breast:  Denies any palpable lumps or discharge All other systems were reviewed with the patient and are negative.  PHYSICAL EXAMINATION: ECOG PERFORMANCE STATUS: 0 -  Asymptomatic  Vitals:   09/12/22 1433  BP: 108/64  Pulse: 72  Resp: 16  Temp: 97.9 F (36.6 C)  SpO2: 100%   Filed Weights   09/12/22 1433  Weight: 125 lb 9.6 oz (57 kg)    GENERAL:alert, no distress and comfortable NECK: supple, thyroid normal size, non-tender, without nodularity BREAST: Small abnormality at the area of recent biopsy.  No large masses.  No regional adenopathy. Neurological: Masked facies, some unilateral tremor and small steps concerning for possible early Parkinson's.  LABORATORY DATA:  I have reviewed the data as listed Lab Results  Component Value Date   WBC 5.0 08/08/2022   HGB 12.9 08/08/2022   HCT 37.4 08/08/2022   MCV 93.5 08/08/2022   PLT 183 08/08/2022   Lab Results  Component Value Date   NA 139 08/08/2022   K 4.2 08/08/2022   CL 106 08/08/2022   CO2 28 08/08/2022    RADIOGRAPHIC STUDIES: I have personally reviewed the radiological reports and agreed with the findings in the report.  ASSESSMENT AND PLAN:   No problem-specific  Assessment & Plan notes found for this encounter.  Total time spent: 60 minutes including history, physical exam, review of records, counseling and coordination of care All questions were answered. The patient knows to call the clinic with any problems, questions or concerns.    Benay Pike, MD 09/12/22

## 2022-09-13 ENCOUNTER — Encounter: Payer: Self-pay | Admitting: *Deleted

## 2022-09-13 ENCOUNTER — Encounter: Payer: Self-pay | Admitting: Hematology and Oncology

## 2022-09-13 DIAGNOSIS — D0512 Intraductal carcinoma in situ of left breast: Secondary | ICD-10-CM

## 2022-09-13 NOTE — Assessment & Plan Note (Addendum)
This is a very pleasant 71 year old postmenopausal female patient with newly diagnosed left breast DCIS involving a papillary lesion, low-grade, ER/PR strongly positive referred to breast MDC for additional recommendations.  We have discussed the following details about DCIS. She is now here status post lumpectomy, no residual DCIS noted on the final pathology.  We have discussed the pathology in detail.  At this time she is willing to proceed with antiestrogen therapy as we previously discussed.  She is once again not so sure of the options.  We have hence discussed about tamoxifen versus aromatase inhibitors, mechanism of action with each class, adverse effects with each class in detail today.  Patient and her husband mentioned that she stays active, walks daily and does not sit on the couch for longer.  Hence we have started her on tamoxifen although there appears to be some confusion about taking tamoxifen versus aromatase inhibitors.  Hence have called him back left a voicemail to call us if they would like me to switch to anastrozole.  They both are equally effective since she has DCIS and since she is over the age of 4.    Thank you for consulting [place] in the care of this patient.  Please do not hesitate to contact us with any additional questions or concerns.

## 2022-09-15 ENCOUNTER — Other Ambulatory Visit: Payer: Self-pay | Admitting: Hematology and Oncology

## 2022-09-15 MED ORDER — ANASTROZOLE 1 MG PO TABS: 1.0000 mg | ORAL_TABLET | Freq: Every day | ORAL | 3 refills | Status: DC

## 2022-09-15 NOTE — Progress Notes (Signed)
Patient prefers anastrozole. Tamoxifen discontinued.  Alton Bouknight

## 2022-09-19 ENCOUNTER — Ambulatory Visit: Payer: PPO

## 2022-09-19 ENCOUNTER — Ambulatory Visit: Payer: PPO | Admitting: Radiation Oncology

## 2022-10-18 ENCOUNTER — Encounter (HOSPITAL_COMMUNITY): Payer: Self-pay

## 2022-11-20 ENCOUNTER — Ambulatory Visit: Payer: PPO | Admitting: Obstetrics & Gynecology

## 2022-11-29 ENCOUNTER — Ambulatory Visit: Payer: PPO | Admitting: Obstetrics & Gynecology

## 2022-12-13 ENCOUNTER — Encounter: Payer: Self-pay | Admitting: Adult Health

## 2022-12-13 ENCOUNTER — Inpatient Hospital Stay: Payer: PPO | Attending: Hematology and Oncology | Admitting: Adult Health

## 2022-12-13 VITALS — BP 134/72 | HR 62 | Temp 97.3°F | Resp 20 | Wt 131.3 lb

## 2022-12-13 DIAGNOSIS — Z170 Estrogen receptor positive status [ER+]: Secondary | ICD-10-CM | POA: Diagnosis not present

## 2022-12-13 DIAGNOSIS — Z79811 Long term (current) use of aromatase inhibitors: Secondary | ICD-10-CM | POA: Diagnosis not present

## 2022-12-13 DIAGNOSIS — Z800 Family history of malignant neoplasm of digestive organs: Secondary | ICD-10-CM | POA: Insufficient documentation

## 2022-12-13 DIAGNOSIS — D0512 Intraductal carcinoma in situ of left breast: Secondary | ICD-10-CM | POA: Diagnosis not present

## 2022-12-17 NOTE — Progress Notes (Signed)
SURVIVORSHIP VISIT:   BRIEF ONCOLOGIC HISTORY:  Oncology History  Ductal carcinoma in situ (DCIS) of left breast  07/11/2022 Mammogram   Bilateral screening mammogram showed possible mass in the left breast warranting further evaluation.  No findings suspicious for malignancy in the right breast.  Diagnostic mammogram confirmed irregular hypoechoic mass in the left breast at 4:00 Axis II centimeters from the nipple measuring 4 mm with internal vascularity corresponding to the mammographic finding.  Ultrasound-guided biopsy recommended to exclude malignancy.   07/20/2022 Breast US   Left breast ultrasound showed left breast mass at 4:00 axis measuring 4 mm corresponding to the mammographic finding.   07/27/2022 Pathology Results   Pathology from the breast biopsy showed DCIS, low-grade involving a papillary lesion. Prognostics showed ER 100% positive strong staining intensity PR 80% positive strong staining intensity   08/06/2022 Initial Diagnosis   Ductal carcinoma in situ (DCIS) of left breast   08/2022 -  Anti-estrogen oral therapy   Anastrozole     INTERVAL HISTORY:  [name] to review her survivorship care plan detailing her treatment course for breast cancer, as well as monitoring long-term side effects of that treatment, education regarding health maintenance, screening, and overall wellness and health promotion.     Overall, [name] reports feeling quite well.  She is taking Anastrozole daily and tolerates it well without any issues.   REVIEW OF SYSTEMS:  Review of Systems  Constitutional:  Negative for appetite change, chills, fatigue, fever and unexpected weight change.  HENT:   Negative for hearing loss, lump/mass and trouble swallowing.   Eyes:  Negative for eye problems and icterus.  Respiratory:  Negative for chest tightness, cough and shortness of breath.   Cardiovascular:  Negative for chest pain, leg swelling and palpitations.  Gastrointestinal:  Negative for  abdominal distention, abdominal pain, constipation, diarrhea, nausea and vomiting.  Endocrine: Negative for hot flashes.  Genitourinary:  Negative for difficulty urinating.   Musculoskeletal:  Negative for arthralgias.  Skin:  Negative for itching and rash.  Neurological:  Negative for dizziness, extremity weakness, headaches and numbness.  Hematological:  Negative for adenopathy. Does not bruise/bleed easily.  Psychiatric/Behavioral:  Negative for depression. The patient is not nervous/anxious.   Breast: Denies any new nodularity, masses, tenderness, nipple changes, or nipple discharge.      PAST MEDICAL/SURGICAL HISTORY:  Past Medical History:  Diagnosis Date   Endometriosis    GERD (gastroesophageal reflux disease)    Hypothyroidism    Kidney stone    Low back pain    STARTED IN JULY 2012   Osteopenia 08/2017   T score -1.3 FRAX 7% / 0.7%   Rosacea    Past Surgical History:  Procedure Laterality Date   ABDOMINAL HYSTERECTOMY     TAH/ WITH BILATERAL SALPINGECTOMIES AND  RIGHT OOPHORECTOMY    BREAST BIOPSY  08/29/2022   MM LT RADIOACTIVE SEED LOC MAMMO GUIDE 08/29/2022 [facility]AMMO[facility]   BREAST LUMPECTOMY WITH RADIOACTIVE SEED LOCALIZATION Left 08/30/2022   Procedure: LEFT BREAST LUMPECTOMY WITH RADIOACTIVE SEED LOCALIZATION;  Surgeon: Jovita Kussmaul, MD;  Location: [facility];  Service: General;  Laterality: Left;   EYE SURGERY  08/2019   Cataracts Bilateral eyes   OOPHORECTOMY     RSO   US ECHOCARDIOGRAPHY  04/01/2009   EF 55-60%     ALLERGIES:  No Known Allergies   CURRENT MEDICATIONS:  Outpatient Encounter Medications as of 12/13/2022  Medication Sig   anastrozole (ARIMIDEX) 1 MG tablet Take 1  tablet (1 mg total) by mouth daily.   CALCIUM PO Take 1,000 mg by mouth.   Cholecalciferol (VITAMIN D3) 2000 UNITS TABS Take 2,000 Units by mouth daily.   Menatetrenone (VITAMIN K2) 100 MCG TABS Take 1 capsule by mouth daily.   metroNIDAZOLE (METROCREAM)  0.75 % cream Apply topically 2 (two) times daily.   [DISCONTINUED] oxyCODONE (ROXICODONE) 5 MG immediate release tablet Take 1 tablet (5 mg total) by mouth every 6 (six) hours as needed for severe pain.   No facility-administered encounter medications on file as of 12/13/2022.     ONCOLOGIC FAMILY HISTORY:  Family History  Problem Relation Age of Onset   Hypertension Mother    Asthma Mother    Atrial fibrillation Father    Colon cancer Maternal Grandmother        dx 66s   Other Maternal Grandfather        brain tumor; dx 45s     SOCIAL HISTORY:  Social History   Socioeconomic History   Marital status: Married    Spouse name: Not on file   Number of children: Not on file   Years of education: Not on file   Highest education level: Not on file  Occupational History   Not on file  Tobacco Use   Smoking status: Never   Smokeless tobacco: Never  Vaping Use   Vaping Use: Never used  Substance and Sexual Activity   Alcohol use: No    Alcohol/week: 0.0 standard drinks of alcohol   Drug use: No   Sexual activity: Not Currently    Comment: 1st intercourse 72 yo-Fewer than 5 partners  Other Topics Concern   Not on file  Social History Narrative   Not on file   Social Determinants of Health   Financial Resource Strain: Not on file  Food Insecurity: Not on file  Transportation Needs: Not on file  Physical Activity: Not on file  Stress: Not on file  Social Connections: Not on file  Intimate Partner Violence: Not on file     OBSERVATIONS/OBJECTIVE:  BP 134/72   Pulse 62   Temp (!) 97.3 F (36.3 C)   Resp 20   Wt 131 lb 4.8 oz (59.6 kg)   SpO2 100%   BMI 21.19 kg/m  GENERAL: Patient is a well appearing female in no acute distress HEENT:  Sclerae anicteric.  Oropharynx clear and moist. No ulcerations or evidence of oropharyngeal candidiasis. Neck is supple.  NODES:  No cervical, supraclavicular, or axillary lymphadenopathy palpated.  BREAST EXAM: Left breast  status postlumpectomy-no signs of local recurrence right breast is benign. LUNGS:  Clear to auscultation bilaterally.  No wheezes or rhonchi. HEART:  Regular rate and rhythm. No murmur appreciated. ABDOMEN:  Soft, nontender.  Positive, normoactive bowel sounds. No organomegaly palpated. MSK:  No focal spinal tenderness to palpation. Full range of motion bilaterally in the upper extremities. EXTREMITIES:  No peripheral edema.   SKIN:  Clear with no obvious rashes or skin changes. No nail dyscrasia. NEURO:  Nonfocal. Well oriented.  Appropriate affect.  LABORATORY DATA:  None for this visit.  DIAGNOSTIC IMAGING:  None for this visit.      ASSESSMENT AND PLAN:  Ms.. Deborah Jones is a pleasant 72 y.o. female with Stage 0 left breast DCIS, ER+/PR+, diagnosed in 08/2022, treated with lumpectomy and anti-estrogen therapy with Anastrozole beginning in 08/2022.  She presents to the Survivorship Clinic for our initial meeting and routine follow-up post-completion of treatment for breast cancer.  1. Stage 0 left breast cancer:  Ms. Corriveau is continuing to recover from definitive treatment for breast cancer. She will follow-up with her medical oncologist, Dr. Chryl Heck in 03/2023 with history and physical exam per surveillance protocol.  She will continue her anti-estrogen therapy with Anastrozole. Thus far, she is tolerating the Anastrozole well, with minimal side effects. Her mammogram is due 07/2023; orders placed today. Today, a comprehensive survivorship care plan and treatment summary was reviewed with the patient today detailing her breast cancer diagnosis, treatment course, potential late/long-term effects of treatment, appropriate follow-up care with recommendations for the future, and patient education resources.  A copy of this summary, along with a letter will be sent to the patient's primary care provider via mail/fax/In Basket message after today's visit.    2. Bone health:  Given Ms. Kapusta's  age/history of breast cancer and her current treatment regimen including anti-estrogen therapy with Anastrozole, she is at risk for bone demineralization.  Her last DEXA scan occurred on January 03, 2022 demonstrating osteopenia with a T-score of -1.9 in the left femoral neck.  Repeat testing is recommended to occur in April 2025. She was given education on specific activities to promote bone health.  3. Cancer screening:  Due to Ms. Marquard's history and her age, she should receive screening for skin cancers, colon cancer.  The information and recommendations are listed on the patient's comprehensive care plan/treatment summary and were reviewed in detail with the patient.    4. Health maintenance and wellness promotion: Ms. Sciandra was encouraged to consume 5-7 servings of fruits and vegetables per day. We reviewed the "Nutrition Rainbow" handout. She was also encouraged to engage in moderate to vigorous exercise for 30 minutes per day most days of the week.   She was instructed to limit her alcohol consumption and continue to abstain from tobacco use.     5. Support services/counseling: It is not uncommon for this period of the patient's cancer care trajectory to be one of many emotions and stressors.  She was given information regarding our available services and encouraged to contact me with any questions or for help enrolling in any of our support group/programs.    Follow up instructions:    -Return to cancer center 03/2023 for f/u with Dr. Chryl Heck  -Mammogram due in 07/2023 -Bone density testing due in 11/2023 -She is welcome to return back to the Survivorship Clinic at any time; no additional follow-up needed at this time.  -Consider referral back to survivorship as a long-term survivor for continued surveillance  The patient was provided an opportunity to ask questions and all were answered. The patient agreed with the plan and demonstrated an understanding of the instructions.   Total  encounter time:30 minutes*in face-to-face visit time, chart review, lab review, care coordination, order entry, and documentation of the encounter time.    Wilber Bihari, NP 12/17/22 4:29 PM Medical Oncology and Hematology [facility] [place],  [id number] Tel. [phone]    Fax. [phone]  *Total Encounter Time as defined by the Centers for Medicare and Medicaid Services includes, in addition to the face-to-face time of a patient visit (documented in the note above) non-face-to-face time: obtaining and reviewing outside history, ordering and reviewing medications, tests or procedures, care coordination (communications with other health care professionals or caregivers) and documentation in the medical record.

## 2023-01-24 ENCOUNTER — Encounter: Payer: Self-pay | Admitting: Adult Health

## 2023-02-19 DIAGNOSIS — H52203 Unspecified astigmatism, bilateral: Secondary | ICD-10-CM | POA: Diagnosis not present

## 2023-02-19 DIAGNOSIS — H26493 Other secondary cataract, bilateral: Secondary | ICD-10-CM | POA: Diagnosis not present

## 2023-02-19 DIAGNOSIS — Z961 Presence of intraocular lens: Secondary | ICD-10-CM | POA: Diagnosis not present

## 2023-02-19 DIAGNOSIS — H524 Presbyopia: Secondary | ICD-10-CM | POA: Diagnosis not present

## 2023-03-14 ENCOUNTER — Encounter: Payer: Self-pay | Admitting: Hematology and Oncology

## 2023-03-14 ENCOUNTER — Inpatient Hospital Stay: Payer: PPO | Attending: Hematology and Oncology | Admitting: Hematology and Oncology

## 2023-03-14 VITALS — BP 137/77 | HR 65 | Temp 97.8°F | Resp 18 | Ht 66.0 in | Wt 140.8 lb

## 2023-03-14 DIAGNOSIS — Z79811 Long term (current) use of aromatase inhibitors: Secondary | ICD-10-CM | POA: Insufficient documentation

## 2023-03-14 DIAGNOSIS — D0512 Intraductal carcinoma in situ of left breast: Secondary | ICD-10-CM

## 2023-03-14 DIAGNOSIS — M8580 Other specified disorders of bone density and structure, unspecified site: Secondary | ICD-10-CM | POA: Diagnosis not present

## 2023-03-14 NOTE — Assessment & Plan Note (Signed)
This is a very pleasant 72 year old postmenopausal female patient with newly diagnosed left breast DCIS involving a papillary lesion, low-grade, ER/PR strongly positive referred to breast MDC for additional recommendations.  We have discussed the following details about DCIS. She is now here status post lumpectomy, no residual DCIS noted on the final pathology.  She is on anastrozole for antiestrogen therapy, tolerating it extremely well.  She denies any adverse effects at all.  No concerns on exam today.  Mammogram ordered for October 2024.  I have encouraged that she also start some walking or some form of exercise in addition to calcium and vitamin D supplementation.  I do believe she has some pill-rolling tremor, masklike facies suggestive of parkinsonism but I do not see this on her diagnosis nor any treatment for this.   She will return to clinic for follow-up in about a year, she wants to alternate our visit with her PCP or OB/GYN.  She understands that she has to have a breast exam every 6 months and self breast exam every month. Baseline bone density done in April 2023 with osteopenia.  We will continue to monitor it every 2 years.  We can consider bisphosphonates if there is worsening of osteopenia on her next scan. Thank you for consulting [place] in the care of this patient.  Please do not hesitate to contact us with any additional questions or concerns.

## 2023-03-14 NOTE — Progress Notes (Signed)
[facility] CONSULT NOTE  Patient Care Team: Garlan Fillers, MD as PCP - General (Internal Medicine) Charna Elizabeth, MD as Consulting Physician (Gastroenterology) Rachel Moulds, MD as Consulting Physician (Hematology and Oncology) Griselda Miner, MD as Consulting Physician (General Surgery) Antony Blackbird, MD as Consulting Physician (Radiation Oncology)  CHIEF COMPLAINTS/PURPOSE OF CONSULTATION:  Newly diagnosed breast cancer  HISTORY OF PRESENTING ILLNESS:  Deborah Jones 72 y.o. female is here because of recent diagnosis of left breast cancer  I reviewed her records extensively and collaborated the history with the patient.  SUMMARY OF ONCOLOGIC HISTORY: Oncology History  Ductal carcinoma in situ (DCIS) of left breast  07/11/2022 Mammogram   Bilateral screening mammogram showed possible mass in the left breast warranting further evaluation.  No findings suspicious for malignancy in the right breast.  Diagnostic mammogram confirmed irregular hypoechoic mass in the left breast at 4:00 Axis II centimeters from the nipple measuring 4 mm with internal vascularity corresponding to the mammographic finding.  Ultrasound-guided biopsy recommended to exclude malignancy.   07/20/2022 Breast US   Left breast ultrasound showed left breast mass at 4:00 axis measuring 4 mm corresponding to the mammographic finding.   07/27/2022 Pathology Results   Pathology from the breast biopsy showed DCIS, low-grade involving a papillary lesion. Prognostics showed ER 100% positive strong staining intensity PR 80% positive strong staining intensity   08/2022 -  Anti-estrogen oral therapy   Anastrozole    Deborah Jones is here for follow-up.  She is taking anastrozole and has been tolerating it extremely well.  She denies any complaints.  She has not been seen regularly.  She has however been taking her calcium and vitamin D supplementation as instructed. Rest of the pertinent 10 point ROS  reviewed and negative  MEDICAL HISTORY:  Past Medical History:  Diagnosis Date   Endometriosis    GERD (gastroesophageal reflux disease)    Hypothyroidism    Kidney stone    Low back pain    STARTED IN JULY 2012   Osteopenia 08/2017   T score -1.3 FRAX 7% / 0.7%   Rosacea     SURGICAL HISTORY: Past Surgical History:  Procedure Laterality Date   ABDOMINAL HYSTERECTOMY     TAH/ WITH BILATERAL SALPINGECTOMIES AND  RIGHT OOPHORECTOMY    BREAST BIOPSY  08/29/2022   MM LT RADIOACTIVE SEED LOC MAMMO GUIDE 08/29/2022 [facility] MAMMO[facility]AP[facility]   BREAST LUMPECTOMY WITH RADIOACTIVE SEED LOCALIZATION Left 08/30/2022   Procedure: LEFT BREAST LUMPECTOMY WITH RADIOACTIVE SEED LOCALIZATION;  Surgeon: Griselda Miner, MD;  Location: [facility];  Service: General;  Laterality: Left;   EYE SURGERY  08/2019   Cataracts Bilateral eyes   OOPHORECTOMY     RSO   US ECHOCARDIOGRAPHY  04/01/2009   EF 55-60%    SOCIAL HISTORY: Social History   Socioeconomic History   Marital status: Married    Spouse name: Not on file   Number of children: Not on file   Years of education: Not on file   Highest education level: Not on file  Occupational History   Not on file  Tobacco Use   Smoking status: Never   Smokeless tobacco: Never  Vaping Use   Vaping Use: Never used  Substance and Sexual Activity   Alcohol use: No    Alcohol/week: 0.0 standard drinks of alcohol   Drug use: No   Sexual activity: Not Currently    Comment: 1st intercourse 72 yo-Fewer than 5 partners  Other  Topics Concern   Not on file  Social History Narrative   Not on file   Social Determinants of Health   Financial Resource Strain: Not on file  Food Insecurity: Not on file  Transportation Needs: Not on file  Physical Activity: Not on file  Stress: Not on file  Social Connections: Not on file  Intimate Partner Violence: Not on file    FAMILY HISTORY: Family History  Problem Relation Age of Onset    Hypertension Mother    Asthma Mother    Atrial fibrillation Father    Colon cancer Maternal Grandmother        dx 50s   Other Maternal Grandfather        brain tumor; dx 76s    ALLERGIES:  has No Known Allergies.  MEDICATIONS:  Current Outpatient Medications  Medication Sig Dispense Refill   anastrozole (ARIMIDEX) 1 MG tablet Take 1 tablet (1 mg total) by mouth daily. 90 tablet 3   CALCIUM PO Take 1,000 mg by mouth.     Cholecalciferol (VITAMIN D3) 2000 UNITS TABS Take 2,000 Units by mouth daily.     Menatetrenone (VITAMIN K2) 100 MCG TABS Take 1 capsule by mouth daily.     metroNIDAZOLE (METROCREAM) 0.75 % cream Apply topically 2 (two) times daily.     No current facility-administered medications for this visit.    REVIEW OF SYSTEMS:   Constitutional: Denies fevers, chills or abnormal night sweats Eyes: Denies blurriness of vision, double vision or watery eyes Ears, nose, mouth, throat, and face: Denies mucositis or sore throat Respiratory: Denies cough, dyspnea or wheezes Cardiovascular: Denies palpitation, chest discomfort or lower extremity swelling Gastrointestinal:  Denies nausea, heartburn or change in bowel habits Skin: Denies abnormal skin rashes Lymphatics: Denies new lymphadenopathy or easy bruising Neurological:Denies numbness, tingling or new weaknesses Behavioral/Psych: Mood is stable, no new changes  Breast:  Denies any palpable lumps or discharge All other systems were reviewed with the patient and are negative.  PHYSICAL EXAMINATION: ECOG PERFORMANCE STATUS: 0 - Asymptomatic  Vitals:   03/14/23 1327  BP: 137/77  Pulse: 65  Resp: 18  Temp: 97.8 F (36.6 C)  SpO2: 99%   Filed Weights   03/14/23 1327  Weight: 140 lb 12.8 oz (63.9 kg)    GENERAL:alert, no distress and comfortable Neurological: Mask facies, pill-rolling tremor. Chest: Bilateral breasts inspected and palpated.  No palpable masses or regional adenopathy.  LABORATORY DATA:  I have  reviewed the data as listed Lab Results  Component Value Date   WBC 5.0 08/08/2022   HGB 12.9 08/08/2022   HCT 37.4 08/08/2022   MCV 93.5 08/08/2022   PLT 183 08/08/2022   Lab Results  Component Value Date   NA 139 08/08/2022   K 4.2 08/08/2022   CL 106 08/08/2022   CO2 28 08/08/2022    RADIOGRAPHIC STUDIES: I have personally reviewed the radiological reports and agreed with the findings in the report.  ASSESSMENT AND PLAN:   Ductal carcinoma in situ (DCIS) of left breast This is a very pleasant 72 year old postmenopausal female patient with newly diagnosed left breast DCIS involving a papillary lesion, low-grade, ER/PR strongly positive referred to breast [facility] for additional recommendations.  We have discussed the following details about DCIS. She is now here status post lumpectomy, no residual DCIS noted on the final pathology.  She is on anastrozole for antiestrogen therapy, tolerating it extremely well.  She denies any adverse effects at all.  No concerns on exam today.  Mammogram ordered for October 2024.  I have encouraged that she also start some walking or some form of exercise in addition to calcium and vitamin D supplementation.  I do believe she has some pill-rolling tremor, masklike facies suggestive of parkinsonism but I do not see this on her diagnosis nor any treatment for this.   She will return to clinic for follow-up in about a year, she wants to alternate our visit with her PCP or OB/GYN.  She understands that she has to have a breast exam every 6 months and self breast exam every month. Baseline bone density done in April 2023 with osteopenia.  We will continue to monitor it every 2 years.  We can consider bisphosphonates if there is worsening of osteopenia on her next scan. Thank you for consulting [place] in the care of this patient.  Please do not hesitate to contact us with any additional questions or concerns.   Total time spent: 30  minutes including history,  physical exam, review of records, counseling and coordination of care All questions were answered. The patient knows to call the clinic with any problems, questions or concerns.    Rachel Moulds, MD 03/14/23

## 2023-03-15 ENCOUNTER — Other Ambulatory Visit (HOSPITAL_BASED_OUTPATIENT_CLINIC_OR_DEPARTMENT_OTHER): Payer: Self-pay

## 2023-03-15 MED ORDER — COMIRNATY 30 MCG/0.3ML IM SUSY
0.3000 mL | PREFILLED_SYRINGE | Freq: Once | INTRAMUSCULAR | 0 refills | Status: AC
  Filled 2023-03-15: qty 0.3, 1d supply, fill #0

## 2023-04-01 DIAGNOSIS — R6889 Other general symptoms and signs: Secondary | ICD-10-CM | POA: Diagnosis not present

## 2023-04-01 DIAGNOSIS — E039 Hypothyroidism, unspecified: Secondary | ICD-10-CM | POA: Diagnosis not present

## 2023-04-01 DIAGNOSIS — E559 Vitamin D deficiency, unspecified: Secondary | ICD-10-CM | POA: Diagnosis not present

## 2023-04-01 DIAGNOSIS — E785 Hyperlipidemia, unspecified: Secondary | ICD-10-CM | POA: Diagnosis not present

## 2023-04-08 DIAGNOSIS — R6889 Other general symptoms and signs: Secondary | ICD-10-CM | POA: Diagnosis not present

## 2023-04-08 DIAGNOSIS — E039 Hypothyroidism, unspecified: Secondary | ICD-10-CM | POA: Diagnosis not present

## 2023-04-08 DIAGNOSIS — Z1339 Encounter for screening examination for other mental health and behavioral disorders: Secondary | ICD-10-CM | POA: Diagnosis not present

## 2023-04-08 DIAGNOSIS — G252 Other specified forms of tremor: Secondary | ICD-10-CM | POA: Diagnosis not present

## 2023-04-08 DIAGNOSIS — C50912 Malignant neoplasm of unspecified site of left female breast: Secondary | ICD-10-CM | POA: Diagnosis not present

## 2023-04-08 DIAGNOSIS — Z170 Estrogen receptor positive status [ER+]: Secondary | ICD-10-CM | POA: Diagnosis not present

## 2023-04-08 DIAGNOSIS — Z0000 Encounter for general adult medical examination without abnormal findings: Secondary | ICD-10-CM | POA: Diagnosis not present

## 2023-04-08 DIAGNOSIS — Z1331 Encounter for screening for depression: Secondary | ICD-10-CM | POA: Diagnosis not present

## 2023-04-08 DIAGNOSIS — R82998 Other abnormal findings in urine: Secondary | ICD-10-CM | POA: Diagnosis not present

## 2023-04-23 ENCOUNTER — Ambulatory Visit (HOSPITAL_COMMUNITY)
Admission: EM | Admit: 2023-04-23 | Discharge: 2023-04-23 | Disposition: A | Discharge status: Home / Self Care | Payer: PPO | Attending: Family Medicine | Admitting: Family Medicine

## 2023-04-23 ENCOUNTER — Encounter (HOSPITAL_COMMUNITY): Payer: Self-pay

## 2023-04-23 DIAGNOSIS — N3090 Cystitis, unspecified without hematuria: Secondary | ICD-10-CM | POA: Diagnosis not present

## 2023-04-23 LAB — POCT URINALYSIS DIP (MANUAL ENTRY)
Bilirubin, UA: NEGATIVE
Glucose, UA: NEGATIVE mg/dL
Ketones, POC UA: NEGATIVE mg/dL
Nitrite, UA: POSITIVE — AB
Protein Ur, POC: >=300 mg/dL — AB
Spec Grav, UA: 1.025 (ref 1.010–1.025)
Urobilinogen, UA: 0.2 E.U./dL
pH, UA: 7 (ref 5.0–8.0)

## 2023-04-23 MED ORDER — CEPHALEXIN 500 MG PO CAPS: 500.0000 mg | ORAL_CAPSULE | Freq: Two times a day (BID) | ORAL | 0 refills | Status: DC

## 2023-04-23 NOTE — Discharge Instructions (Signed)
You have had labs (a urine culture) sent today. We will call you with any significant abnormalities or if there is need to begin or change treatment or pursue further follow up.  You may also review your test results online through MyChart. If you do not have a MyChart account, instructions to sign up should be on your discharge paperwork.

## 2023-04-23 NOTE — ED Provider Notes (Signed)
[facility]    ASSESSMENT & PLAN:  1. Cystitis    Begin: Meds ordered this encounter  Medications   cephALEXin (KEFLEX) 500 MG capsule    Sig: Take 1 capsule (500 mg total) by mouth 2 (two) times daily.    Dispense:  10 capsule    Refill:  0   No signs of pyelonephritis.  Urine culture sent. Will notify patient of any significant results. Will follow up with her PCP or here if not showing improvement over the next 48 hours, sooner if needed.  Outlined signs and symptoms indicating need for more acute intervention. Patient verbalized understanding. After Visit Summary given.  SUBJECTIVE: History from patient and husband. Deborah Jones is a 72 y.o. female who complains of urinary frequency, urgency and dysuria for the past few days. Without associated flank pain, fever, chills, vaginal discharge or bleeding. Gross hematuria: not present. No specific aggravating or alleviating factors reported. No LE edema. Normal PO intake without n/v/d. Without specific abdominal pain. Ambulatory without difficulty. No tx PTA.  LMP: No LMP recorded. Patient has had a hysterectomy.  OBJECTIVE:  Vitals:   04/23/23 1652  BP: 123/76  Pulse: 71  Resp: 16  Temp: 97.7 F (36.5 C)  TempSrc: Oral  SpO2: 94%   General appearance: alert; no distress HENT: oropharynx: moist Lungs: unlabored respirations Skin: warm and dry Neurologic: normal gait Psychological: alert and cooperative; normal mood and affect  Labs Reviewed  POCT URINALYSIS DIP (MANUAL ENTRY) - Abnormal; Notable for the following components:      Result Value   Clarity, UA cloudy (*)    Blood, UA large (*)    Protein Ur, POC >=300 (*)    Nitrite, UA Positive (*)    Leukocytes, UA Large (3+) (*)    All other components within normal limits  URINE CULTURE    No Known Allergies  Past Medical History:  Diagnosis Date   Endometriosis    GERD (gastroesophageal reflux disease)    Hypothyroidism    Kidney  stone    Low back pain    STARTED IN JULY 2012   Osteopenia 08/2017   T score -1.3 FRAX 7% / 0.7%   Rosacea    Social History   Socioeconomic History   Marital status: Married    Spouse name: Not on file   Number of children: Not on file   Years of education: Not on file   Highest education level: Not on file  Occupational History   Not on file  Tobacco Use   Smoking status: Never   Smokeless tobacco: Never  Vaping Use   Vaping status: Never Used  Substance and Sexual Activity   Alcohol use: No    Alcohol/week: 0.0 standard drinks of alcohol   Drug use: No   Sexual activity: Not Currently    Comment: 1st intercourse 72 yo-Fewer than 5 partners  Other Topics Concern   Not on file  Social History Narrative   Not on file   Social Determinants of Health   Financial Resource Strain: Not on file  Food Insecurity: Not on file  Transportation Needs: Not on file  Physical Activity: Not on file  Stress: Not on file  Social Connections: Not on file  Intimate Partner Violence: Not on file   Family History  Problem Relation Age of Onset   Hypertension Mother    Asthma Mother    Atrial fibrillation Father    Colon cancer Maternal Grandmother  dx 10s   Other Maternal Grandfather        brain tumor; dx 37s        Mardella Layman, MD 04/23/23 [phone]

## 2023-04-23 NOTE — ED Triage Notes (Signed)
Patient's husband reports that the patient has had urinary frequency and dysuria x 2-3 days.

## 2023-04-24 LAB — URINE CULTURE: Culture: >=100000 — AB

## 2023-04-26 LAB — URINE CULTURE

## 2023-05-08 ENCOUNTER — Other Ambulatory Visit (HOSPITAL_BASED_OUTPATIENT_CLINIC_OR_DEPARTMENT_OTHER): Payer: Self-pay

## 2023-05-09 ENCOUNTER — Other Ambulatory Visit (HOSPITAL_BASED_OUTPATIENT_CLINIC_OR_DEPARTMENT_OTHER): Payer: Self-pay

## 2023-05-27 ENCOUNTER — Encounter: Payer: Self-pay | Admitting: Neurology

## 2023-05-29 ENCOUNTER — Ambulatory Visit (INDEPENDENT_AMBULATORY_CARE_PROVIDER_SITE_OTHER): Payer: PPO | Admitting: Radiology

## 2023-05-29 ENCOUNTER — Other Ambulatory Visit: Payer: Self-pay | Admitting: Radiology

## 2023-05-29 VITALS — BP 110/72

## 2023-05-29 DIAGNOSIS — N3090 Cystitis, unspecified without hematuria: Secondary | ICD-10-CM

## 2023-05-29 DIAGNOSIS — R32 Unspecified urinary incontinence: Secondary | ICD-10-CM | POA: Diagnosis not present

## 2023-05-29 LAB — URINALYSIS
Bilirubin Urine: NEGATIVE
Ketones, ur: NEGATIVE
Nitrite: POSITIVE — AB
Specific Gravity, Urine: 1.02 (ref 1.001–1.035)
pH: 7 (ref 5.0–8.0)

## 2023-05-29 MED ORDER — NITROFURANTOIN MONOHYD MACRO 100 MG PO CAPS: 100.0000 mg | ORAL_CAPSULE | Freq: Two times a day (BID) | ORAL | 0 refills | Status: DC

## 2023-05-29 NOTE — Progress Notes (Signed)
      SUBJECTIVE: Deborah Jones is a 72 y.o. female treated for UTI last month with keflex, culture grew out citrobacter brakki, susceptibility testing not done for keflex. Reports occasional urine leakage, small amounts, no associated with stress. Had 1 episode of night time incontinence enough to have to change clothes last month.  No Known Allergies  Current Outpatient Medications on File Prior to Visit  Medication Sig Dispense Refill   anastrozole (ARIMIDEX) 1 MG tablet Take 1 tablet (1 mg total) by mouth daily. 90 tablet 3   CALCIUM PO Take 1,000 mg by mouth.     Cholecalciferol (VITAMIN D3) 2000 UNITS TABS Take 2,000 Units by mouth daily.     Menatetrenone (VITAMIN K2) 100 MCG TABS Take 1 capsule by mouth daily.     No current facility-administered medications on file prior to visit.    Past Medical History:  Diagnosis Date   Endometriosis    GERD (gastroesophageal reflux disease)    Hypothyroidism    Kidney stone    Low back pain    STARTED IN JULY 2012   Osteopenia 08/2017   T score -1.3 FRAX 7% / 0.7%   Rosacea      OBJECTIVE: Appears well, in no apparent distress.  Vital signs are normal. The abdomen is soft without tenderness, guarding, mass, rebound or organomegaly. No CVA tenderness or inguinal adenopathy noted.   Urine dipstick shows positive for leukocytes.     Blood pressure 110/72.    Chaperone offered and declined for exam.  ASSESSMENT/PLAN: 1. Urinary incontinence, unspecified type Likely r/t cystitis - Urinalysis,Complete w/RFL Culture  2. Cystitis - nitrofurantoin, macrocrystal-monohydrate, (MACROBID) 100 MG capsule; Take 1 capsule (100 mg total) by mouth 2 (two) times daily.  Dispense: 14 capsule; Refill: 0    Not enough urine for C+S, if sx persist will bring back for further testing Treatment per orders - also push fluids, avoid bladder irritants. Instructed she may use Pyridium OTC prn. Call or return to clinic prn if these symptoms worsen  or fail to improve as anticipated. Pyelo precautions reviewed with patient.   Arlie Solomons B WHNP-BC 12:31 PM 05/29/2023

## 2023-05-29 NOTE — Addendum Note (Signed)
Addended by: Rushie Goltz on: 05/29/2023 02:03 PM   Modules accepted: Orders

## 2023-06-04 DIAGNOSIS — N3090 Cystitis, unspecified without hematuria: Secondary | ICD-10-CM

## 2023-06-04 NOTE — Progress Notes (Unsigned)
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  Discussed differences between idiopathic Parkinson's disease and vascular parkinsonism.  I do not recommend levodopa for her, especially given the mild nature of parkinsonian symptoms, and also given the fact that she has fairly significant memory change.  -I would recommend MRI of the brain and they were agreeable.  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  While CSF tau is much more excepted clinically, there is emerging data regarding ptau217 but this is still not checked regularly for the dx of AD.  Not sure that doing CSF testing adds much to her clinically  -Discussed neurocognitive testing, but they feel that this could be difficult for the patient and did not add much clinically.    Subjective:   Deborah Jones was seen today in the movement disorders clinic for neurologic consultation at the request of Garlan Fillers, MD.  This patient is accompanied in the office by her spouse who supplements the history. The consultation is for the evaluation of bilateral upper extremity rest tremor, right greater than left.  Outside records that were made available to me were reviewed.   Tremor: Yes.     How long has it been going on? At least 1 year  At rest or with activation?  rest  Fam hx of tremor?  No.  Located where?  Bilateral per husband; she is R hand dominant  Affected by caffeine:  doesn't drink much  Affected by alcohol:  rarely drinks  Affected by stress:  No.  Spills glass of liquid if full:  No.   Other Specific Symptoms:  Voice: she's always been soft spoken per husband Sleep:   Vivid Dreams:  No.  Acting out dreams:  No. Wet Pillows: No. Postural symptoms:  No. Per pt but husband states "she walks very carefully"  Falls?  No. Bradykinesia symptoms: no bradykinesia  noted Loss of smell:  No. Loss of taste:  No. Urinary Incontinence:  Yes.   But was related to UTI and only ever had "a little leakage" Difficulty Swallowing:  No. Per pt but husband states that she has trouble with pills (sounds like apraxia); no trouble with swallowing with eating Trouble with ADL's:  Yes.   (Husband notes that shoes may be on wrong feet)  Trouble buttoning clothing: No. Depression:  No. Memory changes:  Yes.   - does well with names, events, places, people; has trouble with spatial and time and had to quit driving.  Memory issues became more "noticeable" a year ago.   Hallucinations:  No.  visual distortions: No. N/V:  No. Lightheaded:  No.  Syncope: No. Diplopia:  No. Dyskinesia:  No.  Last neuroimaging of the brain was in December, 2021 and that was a CT brain without contrast and it was unremarkable.  PREVIOUS MEDICATIONS: none to date  ALLERGIES:  No Known Allergies  CURRENT MEDICATIONS:  Current Outpatient Medications  Medication Instructions   anastrozole (ARIMIDEX) 1 mg, Oral, Daily   CALCIUM PO 1,000 mg, Oral   Menatetrenone (VITAMIN K2) 100 MCG TABS 1 capsule, Oral, Daily   Vitamin D3 2,000 Units, Oral, Daily    Objective:   PHYSICAL EXAMINATION:    VITALS:   Vitals:   06/06/23 1313  BP: 124/72  Pulse: 78  SpO2: 98%  Weight: 144 lb 9.6 oz (65.6 kg)  Height: 5\' 7"  (1.702 m)    GEN:  The patient appears stated age and is in NAD. HEENT:  Normocephalic, atraumatic.  The mucous membranes are moist. The superficial temporal arteries are without ropiness or tenderness. CV:  RRR Lungs:  CTAB Neck/HEME:  There are no carotid bruits bilaterally.  Neurological examination:  Orientation: The patient is alert and oriented to person and place.  She looked to her husband for details of the history and had fairly significant trouble with that. Cranial nerves: There is good facial symmetry.  Extraocular muscles are intact. The visual fields are full  to confrontational testing. The speech is fluent and clear. Soft palate rises symmetrically and there is no tongue deviation. Hearing is intact to conversational tone. Sensation: Sensation is intact to light touch throughout (facial, trunk, extremities). Vibration is intact at the bilateral big toe. There is no extinction with double simultaneous stimulation.  Motor: Strength is 5/5 in the bilateral upper and lower extremities.   Shoulder shrug is equal and symmetric.  There is no pronator drift. Deep tendon reflexes: Deep tendon reflexes are 2/4 at the bilateral biceps, triceps, brachioradialis, patella and achilles. Plantar responses are downgoing bilaterally.  Movement examination: Tone: There is normal tone in the bilateral upper extremities.  The tone in the lower extremities is normal.  Abnormal movements: Rare rest tremor in the fingers on the right hand.  She does have tremor when she ambulates in the hands on both sides.  She has no postural or intention tremor.   Coordination:  There is no decremation with RAM's, with any form of RAMS, including alternating supination and pronation of the forearm, hand opening and closing, finger taps, heel taps and toe taps.  When asked to perform these movements, however, she is fairly apraxic with some of the commands. Gait and Station: The patient has no difficulty arising out of a deep-seated chair without the use of the hands. The patient's stride length is good.  She has wide-based gait.  She is fairly purposeful with arm swing.  When she arrives back into the room, she stands in the doorway and needs to be told that she can sit back in the chair next to her husband. I have reviewed and interpreted the following labs independently   Chemistry      Component Value Date/Time   NA 139 08/08/2022 1232   K 4.2 08/08/2022 1232   CL 106 08/08/2022 1232   CO2 28 08/08/2022 1232   BUN 24 (H) 08/08/2022 1232   CREATININE 0.80 08/08/2022 1232   CREATININE  0.77 06/03/2015 1320      Component Value Date/Time   CALCIUM 9.0 08/08/2022 1232   ALKPHOS 64 08/08/2022 1232   AST 24 08/08/2022 1232   ALT 13 08/08/2022 1232   BILITOT 0.5 08/08/2022 1232      Lab Results  Component Value Date   TSH 2.97 01/12/2020   Lab Results  Component Value Date   WBC 5.0 08/08/2022   HGB 12.9 08/08/2022   HCT 37.4 08/08/2022   MCV 93.5 08/08/2022   PLT 183 08/08/2022   ptau 217 has been done (0.87H) Rpr neg B12 923 - 04/17/21 Tsh:  4.20 01/25/22  Total time spent on today's visit was 60 minutes, including both face-to-face time and nonface-to-face time.  Time included that spent on review of records (prior notes available to me/labs/imaging if pertinent), discussing treatment and goals, answering patient's questions and coordinating care.  Cc:  Garlan Fillers, MD

## 2023-06-05 NOTE — Telephone Encounter (Signed)
Future order placed for UA, reflex culture prn.  Appt scheduled for 06/07/2023 @ 10.   Routing to provider for final review.

## 2023-06-05 NOTE — Telephone Encounter (Signed)
I would recommend a repeat u/a (lab visit only)

## 2023-06-06 ENCOUNTER — Ambulatory Visit: Payer: PPO | Admitting: Neurology

## 2023-06-06 ENCOUNTER — Encounter: Payer: Self-pay | Admitting: Neurology

## 2023-06-06 VITALS — BP 124/72 | HR 78 | Ht 67.0 in | Wt 144.6 lb

## 2023-06-06 DIAGNOSIS — G214 Vascular parkinsonism: Secondary | ICD-10-CM

## 2023-06-06 DIAGNOSIS — R413 Other amnesia: Secondary | ICD-10-CM

## 2023-06-06 NOTE — Patient Instructions (Addendum)
A referral to [facility] has been placed for your MRI someone will contact you directly to schedule your appt. They are located at [place]. Please contact them directly by calling [phone] with any questions regarding your referral.  The physicians and staff at [facility] are committed to providing excellent care. You may receive a survey requesting feedback about your experience at our office. We strive to receive "very good" responses to the survey questions. If you feel that your experience would prevent you from giving the office a "very good " response, please contact our office to try to remedy the situation. We may be reached at [phone]. Thank you for taking the time out of your busy day to complete the survey.

## 2023-06-07 ENCOUNTER — Other Ambulatory Visit: Payer: PPO

## 2023-06-07 DIAGNOSIS — N3090 Cystitis, unspecified without hematuria: Secondary | ICD-10-CM | POA: Diagnosis not present

## 2023-06-10 LAB — URINE CULTURE
MICRO NUMBER:: 15433714
Result:: NO GROWTH
SPECIMEN QUALITY:: ADEQUATE

## 2023-06-10 LAB — URINALYSIS, COMPLETE W/RFL CULTURE
Bacteria, UA: NONE SEEN /HPF
Bilirubin Urine: NEGATIVE
Glucose, UA: NEGATIVE
Hgb urine dipstick: NEGATIVE
Hyaline Cast: NONE SEEN /LPF
Ketones, ur: NEGATIVE
Leukocyte Esterase: NEGATIVE
Nitrites, Initial: NEGATIVE
Protein, ur: NEGATIVE
Specific Gravity, Urine: 1.021 (ref 1.001–1.035)
Squamous Epithelial / HPF: NONE SEEN /HPF (ref <=5)
WBC, UA: NONE SEEN /HPF (ref 0–5)
pH: 6.5 (ref 5.0–8.0)

## 2023-06-10 LAB — CULTURE INDICATED

## 2023-06-23 ENCOUNTER — Ambulatory Visit
Admission: RE | Admit: 2023-06-23 | Discharge: 2023-06-23 | Disposition: A | Discharge status: Home / Self Care | Payer: PPO | Source: Ambulatory Visit | Attending: Neurology | Admitting: Neurology

## 2023-06-23 DIAGNOSIS — F03A0 Unspecified dementia, mild, without behavioral disturbance, psychotic disturbance, mood disturbance, and anxiety: Secondary | ICD-10-CM | POA: Diagnosis not present

## 2023-06-25 ENCOUNTER — Encounter: Payer: Self-pay | Admitting: Obstetrics and Gynecology

## 2023-06-25 ENCOUNTER — Telehealth: Payer: Self-pay

## 2023-06-25 ENCOUNTER — Ambulatory Visit (INDEPENDENT_AMBULATORY_CARE_PROVIDER_SITE_OTHER): Payer: PPO | Admitting: Obstetrics and Gynecology

## 2023-06-25 VITALS — BP 118/68 | HR 72 | Temp 97.7°F | Resp 16

## 2023-06-25 DIAGNOSIS — N952 Postmenopausal atrophic vaginitis: Secondary | ICD-10-CM

## 2023-06-25 DIAGNOSIS — R300 Dysuria: Secondary | ICD-10-CM

## 2023-06-25 LAB — CULTURE INDICATED

## 2023-06-25 MED ORDER — NITROFURANTOIN MONOHYD MACRO 100 MG PO CAPS: 100.0000 mg | ORAL_CAPSULE | Freq: Two times a day (BID) | ORAL | 0 refills | Status: DC

## 2023-06-25 MED ORDER — FLUCONAZOLE 150 MG PO TABS: 150.0000 mg | ORAL_TABLET | Freq: Once | ORAL | 0 refills | Status: AC

## 2023-06-25 MED ORDER — CEPHALEXIN 500 MG PO CAPS: 500.0000 mg | ORAL_CAPSULE | Freq: Four times a day (QID) | ORAL | 0 refills | Status: DC

## 2023-06-25 MED ORDER — ESTRADIOL 0.1 MG/GM VA CREA: 1.0000 | TOPICAL_CREAM | Freq: Every day | VAGINAL | 12 refills | Status: DC

## 2023-06-25 MED ORDER — PHENAZOPYRIDINE HCL 200 MG PO TABS: 200.0000 mg | ORAL_TABLET | Freq: Three times a day (TID) | ORAL | 0 refills | Status: AC

## 2023-06-25 NOTE — Progress Notes (Signed)
Patient presents with dysuria. She was treated for another UTI in July. Went to urgent care for this and the antibiotic and had to return for another antibiotic (keflex) that resolved it.  Susceptibility   Citrobacter braakii    MIC    CEFEPIME <=0.12 SENS... Sensitive    CEFTRIAXONE <=0.25 SENS... Sensitive    CIPROFLOXACIN <=0.25 SENS... Sensitive    GENTAMICIN <=1 SENSITIVE Sensitive    IMIPENEM 1 SENSITIVE Sensitive    NITROFURANTOIN <=16 SENSIT... Sensitive    PIP/TAZO >=128 RESIS... Resistant    TRIMETH/SULFA <=20 SENSIT... Sensitive           Susceptibility Comments  Citrobacter braakii  >=100,000 COLONIES/mL CITROBACTER BRAAKII      Specimen Collected: 04/23/23 17:35 Last Resulted: 04/26/23 09:13      Lab Flowsheet      Order Details      View Encounter      Lab and Collection Details      Routing      Result Hist     In and out cath obtained urine sample to send: cloudy A/p based on last culture, to send in macrobid for seven days.  Diflucan to take after to prevent a yeast infection. To apply estrogen to vagina to help reduce risk of UTI for bladder strength and atrophic vaginitis.  Dr. Karma Greaser

## 2023-06-25 NOTE — Telephone Encounter (Signed)
Brook from [facility] called to confirm if pt needed the kelfex and macrobid that was sent in from today.   Per [facility]: "Macrobid only."  Brook notified and voiced understanding.  Encounter closed.

## 2023-06-27 LAB — URINALYSIS, COMPLETE W/RFL CULTURE
Bilirubin Urine: NEGATIVE
Glucose, UA: NEGATIVE
Hyaline Cast: NONE SEEN /LPF
Nitrites, Initial: NEGATIVE
Specific Gravity, Urine: 1.021 (ref 1.001–1.035)
pH: 5.5 (ref 5.0–8.0)

## 2023-06-27 LAB — URINE CULTURE
MICRO NUMBER:: 15509509
SPECIMEN QUALITY:: ADEQUATE

## 2023-06-28 ENCOUNTER — Encounter: Payer: Self-pay | Admitting: Neurology

## 2023-07-01 NOTE — Telephone Encounter (Signed)
Spoke with patients spouse, Hessie Diener, ok per dpr. Advised 06/25/23 urine culture sensitivity indicated Macrobid sensitive.   Hessie Diener denies any GYN or urinary symptoms. States he is unsure if Kyra had a "rough" night, seems to be improved today. Laroy Apple to f/u with PCP for further evaluation, ER/urgent care if symptoms worsen. Has 1 day left of abx. Advised I will update Dr. Karma Greaser and return call if any additional recommendations. Hessie Diener agreeable.   Routing to Dr. Karma Greaser for final review.

## 2023-07-15 ENCOUNTER — Ambulatory Visit
Admission: RE | Admit: 2023-07-15 | Discharge: 2023-07-15 | Disposition: A | Discharge status: Home / Self Care | Payer: PPO | Source: Ambulatory Visit | Attending: Adult Health | Admitting: Adult Health

## 2023-07-15 DIAGNOSIS — Z853 Personal history of malignant neoplasm of breast: Secondary | ICD-10-CM | POA: Diagnosis not present

## 2023-07-15 DIAGNOSIS — D0512 Intraductal carcinoma in situ of left breast: Secondary | ICD-10-CM

## 2023-07-30 ENCOUNTER — Encounter: Payer: Self-pay | Admitting: Neurology

## 2023-08-06 NOTE — Progress Notes (Signed)
Virtual Visit Via Video       Consent was obtained for video visit:  Yes.   Answered questions that patient had about telehealth interaction:  Yes.   I discussed the limitations, risks, security and privacy concerns of performing an evaluation and management service by telemedicine. I also discussed with the patient that there may be a patient responsible charge related to this service. The patient expressed understanding and agreed to proceed.  Pt location: Home Physician Location: office Name of referring provider:  Garlan Fillers, MD I connected with Deborah Jones at patients initiation/request on 08/09/2023 at  2:00 PM EST by video enabled telemedicine application and verified that I am speaking with the correct person using two identifiers. Pt MRN:  [id number] Pt DOB:  28-Jul-1951 Video Participants:  Deborah Jones;  husband supplements hx  Assessment/Plan:   Tremor  -doesn't meet [place] brain bank criteria for the dx of Parkinsons Disease and really do not see any significant parkinsonism, with the exception of some tremor.  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  -Has had ptau217 checked and was elevated at 0.87.  While CSF tau is much more excepted clinically, there is emerging data regarding ptau217 but this is still not checked regularly for the dx of AD.  Not sure that doing CSF testing adds much to her clinically  -Discussed neurocognitive testing again, but they feel that this could be difficult for the patient and did not add much clinically.  Do not think that she would be candidate for the newer meds such as leqembi.  Discussed acetylcholinesterase inhibitors and they did not feel benefit was great enough to start.  Follow-up as needed    Subjective:   Deborah Jones was seen today in follow-up to review her MRI of the brain.  We did send results through her  portal.  MRI brain demonstrated moderate atrophy and mild small vessel disease.  This was not unexpected.  PREVIOUS MEDICATIONS: none to date  ALLERGIES:  No Known Allergies  CURRENT MEDICATIONS:  Current Outpatient Medications  Medication Instructions   anastrozole (ARIMIDEX) 1 mg, Oral, Daily   CALCIUM PO 1,000 mg, Oral   estradiol (ESTRACE VAGINAL) 0.1 MG/GM vaginal cream 1 Applicatorful, Vaginal, Daily at bedtime, Apply 1 ml to vagina at bedtime for 2 weeks then 3 times a week thereafter   Menatetrenone (VITAMIN K2) 100 MCG TABS 1 capsule, Oral, Daily   nitrofurantoin (macrocrystal-monohydrate) (MACROBID) 100 mg, Oral, 2 times daily   Vitamin D3 2,000 Units, Oral, Daily    Objective:   PHYSICAL EXAMINATION:    VITALS:   There were no vitals filed for this visit.   GEN:  The patient appears stated age and is in NAD. HEENT:  Normocephalic, atraumatic.    Neurological examination:  Orientation: The patient is alert and oriented to person and place.  She looked to her husband for details of the history.  This is similar to last visit. Cranial nerves: There is good facial symmetry.   The speech is fluent and clear.     I have reviewed and interpreted the following labs independently   Chemistry      Component Value Date/Time   NA 139 08/08/2022 1232   K 4.2 08/08/2022 1232   CL 106 08/08/2022 1232   CO2 28 08/08/2022 1232   BUN 24 (H) 08/08/2022 1232   CREATININE 0.80 08/08/2022 1232  CREATININE 0.77 06/03/2015 1320      Component Value Date/Time   CALCIUM 9.0 08/08/2022 1232   ALKPHOS 64 08/08/2022 1232   AST 24 08/08/2022 1232   ALT 13 08/08/2022 1232   BILITOT 0.5 08/08/2022 1232      Lab Results  Component Value Date   TSH 2.97 01/12/2020   Lab Results  Component Value Date   WBC 5.0 08/08/2022   HGB 12.9 08/08/2022   HCT 37.4 08/08/2022   MCV 93.5 08/08/2022   PLT 183 08/08/2022   ptau 217 has been done (0.87H) Rpr neg B12 923 - 04/17/21 Tsh:   4.20 01/25/22  Follow up Instructions      -I discussed the assessment and treatment plan with the patient. The patient was provided an opportunity to ask questions and all were answered. The patient agreed with the plan and demonstrated an understanding of the instructions.   The patient was advised to call back or seek an in-person evaluation if the symptoms worsen or if the condition fails to improve as anticipated.    Total time spent on today's visit was 20 minutes, including both face-to-face time and nonface-to-face time.  Time included that spent on review of records (prior notes available to me/labs/imaging if pertinent), discussing treatment and goals, answering patient's questions and coordinating care.   Kerin Salen, DO   Cc:  Garlan Fillers, MD

## 2023-08-09 ENCOUNTER — Telehealth: Payer: PPO | Admitting: Neurology

## 2023-08-09 DIAGNOSIS — R413 Other amnesia: Secondary | ICD-10-CM | POA: Diagnosis not present

## 2023-09-09 ENCOUNTER — Telehealth: Payer: Self-pay | Admitting: Internal Medicine

## 2023-09-12 ENCOUNTER — Other Ambulatory Visit: Payer: Self-pay | Admitting: Hematology and Oncology

## 2023-10-01 ENCOUNTER — Ambulatory Visit (INDEPENDENT_AMBULATORY_CARE_PROVIDER_SITE_OTHER): Payer: PPO | Admitting: Internal Medicine

## 2023-10-01 ENCOUNTER — Encounter: Payer: Self-pay | Admitting: Internal Medicine

## 2023-10-01 VITALS — BP 120/80 | HR 76 | Temp 97.6°F | Ht 67.0 in | Wt 148.0 lb

## 2023-10-01 DIAGNOSIS — G319 Degenerative disease of nervous system, unspecified: Secondary | ICD-10-CM | POA: Insufficient documentation

## 2023-10-01 DIAGNOSIS — N952 Postmenopausal atrophic vaginitis: Secondary | ICD-10-CM | POA: Diagnosis not present

## 2023-10-01 DIAGNOSIS — F03918 Unspecified dementia, unspecified severity, with other behavioral disturbance: Secondary | ICD-10-CM | POA: Insufficient documentation

## 2023-10-01 DIAGNOSIS — D0512 Intraductal carcinoma in situ of left breast: Secondary | ICD-10-CM | POA: Diagnosis not present

## 2023-10-01 DIAGNOSIS — N393 Stress incontinence (female) (male): Secondary | ICD-10-CM | POA: Insufficient documentation

## 2023-10-01 DIAGNOSIS — R251 Tremor, unspecified: Secondary | ICD-10-CM | POA: Diagnosis not present

## 2023-10-01 NOTE — Assessment & Plan Note (Signed)
Using estrace cream and her ob/gyn is retiring. She can get a new one if desired or we can take over her care there. She is s/p hysterectomy.

## 2023-10-01 NOTE — Assessment & Plan Note (Signed)
Taking arimidex currently and no reported side effects.

## 2023-10-01 NOTE — Assessment & Plan Note (Signed)
Rare episodes of small volume urine loss. No clear triggers. Asked to do pelvic floor exercises to help. Not requiring medication at this time.

## 2023-10-01 NOTE — Assessment & Plan Note (Signed)
Mild and workup negative for parkinson's disease with neurology. At this time symptoms are not interfering with life and medication is not warranted. If this changes we can reassess.

## 2023-10-01 NOTE — Progress Notes (Signed)
   Subjective:   Patient ID: Deborah Jones, female    DOB: 05-10-1951, 72 y.o.   MRN: [id number]  Urinary Frequency  Associated symptoms include frequency. Pertinent negatives include no nausea or vomiting.   The patient is a 72 YO female coming in new for urinary leakage and some tremors as well as ongoing care see A/P for details. Husband present with her and helps to provide history. He does have to assist with dressing sometimes and he does all cooking, she does not drive. Some memory changes mostly with spatial and visual orientation items. Remote memory intact  PMH, FMH, social history reviewed and updated  Review of Systems  Constitutional: Negative.   HENT: Negative.    Eyes: Negative.   Respiratory:  Negative for cough, chest tightness and shortness of breath.   Cardiovascular:  Negative for chest pain, palpitations and leg swelling.  Gastrointestinal:  Negative for abdominal distention, abdominal pain, constipation, diarrhea, nausea and vomiting.  Genitourinary:  Positive for frequency.  Musculoskeletal: Negative.   Skin: Negative.   Neurological:  Positive for tremors.  Psychiatric/Behavioral: Negative.      Objective:  Physical Exam Constitutional:      Appearance: She is well-developed.  HENT:     Head: Normocephalic and atraumatic.  Cardiovascular:     Rate and Rhythm: Normal rate and regular rhythm.  Pulmonary:     Effort: Pulmonary effort is normal. No respiratory distress.     Breath sounds: Normal breath sounds. No wheezing or rales.  Abdominal:     General: Bowel sounds are normal. There is no distension.     Palpations: Abdomen is soft.     Tenderness: There is no abdominal tenderness. There is no rebound.  Musculoskeletal:     Cervical back: Normal range of motion.  Skin:    General: Skin is warm and dry.  Neurological:     Mental Status: She is alert and oriented to person, place, and time.     Coordination: Coordination normal.  Psychiatric:      Comments: Husband does most of the talking she is able to answer questions but slowed response time     Vitals:   10/01/23 0820  BP: 120/80  Pulse: 76  Temp: 97.6 F (36.4 C)  TempSrc: Oral  SpO2: 99%  Weight: 148 lb (67.1 kg)  Height: 5' 7 (1.702 m)    Assessment & Plan:

## 2023-10-01 NOTE — Assessment & Plan Note (Signed)
 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 Husband mentions prior PCP suspected MCI and not on medication currently. Continue to monitor. She does need assistance with dressing, cooking and IADLs of driving, cleaning, cooking.

## 2023-11-06 ENCOUNTER — Encounter: Payer: Self-pay | Admitting: Internal Medicine

## 2023-11-26 DIAGNOSIS — R413 Other amnesia: Secondary | ICD-10-CM | POA: Diagnosis not present

## 2023-11-26 DIAGNOSIS — Z1211 Encounter for screening for malignant neoplasm of colon: Secondary | ICD-10-CM | POA: Diagnosis not present

## 2023-12-03 ENCOUNTER — Encounter: Payer: Self-pay | Admitting: Internal Medicine

## 2023-12-04 NOTE — Telephone Encounter (Signed)
 Forms were received in faxes today, 3.5.25, and placed in PCP's box

## 2023-12-06 NOTE — Telephone Encounter (Signed)
 Called and filled this out

## 2023-12-06 NOTE — Telephone Encounter (Signed)
 I was able to verify with patient that we already have the forms and will be filled out today.

## 2024-01-28 ENCOUNTER — Encounter: Payer: Self-pay | Admitting: Internal Medicine

## 2024-01-30 ENCOUNTER — Ambulatory Visit

## 2024-01-30 VITALS — Ht 67.0 in | Wt 148.0 lb

## 2024-01-30 DIAGNOSIS — Z0000 Encounter for general adult medical examination without abnormal findings: Secondary | ICD-10-CM

## 2024-01-30 NOTE — Progress Notes (Signed)
 Subjective:  Please attest and cosign this visit due to patients primary care provider not being in the office at the time the visit was completed.  (Pt of Dr Bambi Lever)   Deborah Jones is a 73 y.o. who presents for a Medicare Wellness preventive visit.  Visit Complete: Virtual I connected with  Siri Duet on 01/30/24 by a audio enabled telemedicine application and verified that I am speaking with the correct person using two identifiers.  Patient Location: Home  Provider Location: Office/Clinic  I discussed the limitations of evaluation and management by telemedicine. The patient expressed understanding and agreed to proceed.  Vital Signs: Because this visit was a virtual/telehealth visit, some criteria may be missing or patient reported. Any vitals not documented were not able to be obtained and vitals that have been documented are patient reported.  VideoDeclined- This patient declined Librarian, academic. Therefore the visit was completed with audio only.  Persons Participating in Visit: Patient assisted by Spouse, Angelo Barge.  AWV Questionnaire: No: Patient Medicare AWV questionnaire was not completed prior to this visit.  Cardiac Risk Factors include: advanced age (>51men, >53 women)     Objective:    Today's Vitals   01/30/24 1402  Weight: 148 lb (67.1 kg)  Height: 5\' 7"  (1.702 m)   Body mass index is 23.18 kg/m.     01/30/2024    2:01 PM 06/06/2023    1:14 PM 12/13/2022    2:08 PM 08/30/2022   12:15 PM 03/28/2020    9:08 AM  Advanced Directives  Does Patient Have a Medical Advance Directive? Yes Yes Yes Yes Yes  Type of Estate agent of [place];Living will Living will Healthcare Power of [place];Living will Healthcare Power of Attorney   Does patient want to make changes to medical advance directive?   No - Patient declined No - Patient declined   Copy of Healthcare Power of Attorney in Chart? No  - copy requested   No - copy requested     Current Medications (verified) Outpatient Encounter Medications as of 01/30/2024  Medication Sig   anastrozole  (ARIMIDEX ) 1 MG tablet Take 1 tablet (1 mg total) by mouth daily.   CALCIUM PO Take 1,000 mg by mouth.   Cholecalciferol (VITAMIN D3) 2000 UNITS TABS Take 2,000 Units by mouth daily.   Menatetrenone (VITAMIN K2) 100 MCG TABS Take 1 capsule by mouth daily.   [DISCONTINUED] estradiol  (ESTRACE  VAGINAL) 0.1 MG/GM vaginal cream Place 1 Applicatorful vaginally at bedtime. Apply 1 ml to vagina at bedtime for 2 weeks then 3 times a week thereafter   No facility-administered encounter medications on file as of 01/30/2024.    Allergies (verified) Patient has no known allergies.   History: Past Medical History:  Diagnosis Date   Breast cancer (HCC)    Cataract    Lens replacement   Endometriosis    GERD (gastroesophageal reflux disease)    Hypothyroidism    Kidney stone    Low back pain    STARTED IN JULY 2012   Osteopenia 08/2017   T score -1.3 FRAX 7% / 0.7%   Rosacea    Past Surgical History:  Procedure Laterality Date   ABDOMINAL HYSTERECTOMY     TAH/ WITH BILATERAL SALPINGECTOMIES AND  RIGHT OOPHORECTOMY    BREAST BIOPSY  08/29/2022   MM LT RADIOACTIVE SEED LOC MAMMO GUIDE 08/29/2022 [facility] MAMMOGRAPHY   BREAST LUMPECTOMY Left 08/30/2022   BREAST LUMPECTOMY WITH RADIOACTIVE SEED LOCALIZATION Left  08/30/2022   Procedure: LEFT BREAST LUMPECTOMY WITH RADIOACTIVE SEED LOCALIZATION;  Surgeon: Caralyn Chandler, MD;  Location: [facility];  Service: General;  Laterality: Left;   EYE SURGERY  08/2019   Cataracts Bilateral eyes   OOPHORECTOMY     RSO   US  ECHOCARDIOGRAPHY  04/01/2009   EF 55-60%   Family History  Problem Relation Age of Onset   Hypertension Mother    Asthma Mother    Atrial fibrillation Father    Colon cancer Maternal Grandmother        dx 15s   Cancer Maternal Grandmother    Other Maternal  Grandfather        brain tumor; dx 12s   Cancer Maternal Grandfather    Social History   Socioeconomic History   Marital status: Married    Spouse name: Not on file   Number of children: Not on file   Years of education: Not on file   Highest education level: Master's degree (e.g., MA, MS, MEng, MEd, MSW, MBA)  Occupational History   Occupation: retired    Comment: Teacher, early years/pre at [employer]  Tobacco Use   Smoking status: Never    Passive exposure: Never   Smokeless tobacco: Never  Vaping Use   Vaping status: Never Used  Substance and Sexual Activity   Alcohol  use: Not Currently   Drug use: No   Sexual activity: Not Currently    Partners: Male    Birth control/protection: Surgical    Comment: 1st intercourse 73 yo-Fewer than 5 partners, hysterectomy  Other Topics Concern   Not on file  Social History Narrative   Right handed    Social Drivers of Health   Financial Resource Strain: Low Risk  (01/30/2024)   Overall Financial Resource Strain (CARDIA)    Difficulty of Paying Living Expenses: Not hard at all  Food Insecurity: No Food Insecurity (01/30/2024)   Hunger Vital Sign    Worried About Running Out of Food in the Last Year: Never true    Ran Out of Food in the Last Year: Never true  Transportation Needs: No Transportation Needs (01/30/2024)   PRAPARE - Administrator, Civil Service (Medical): No    Lack of Transportation (Non-Medical): No  Physical Activity: Insufficiently Active (01/30/2024)   Exercise Vital Sign    Days of Exercise per Week: 2 days    Minutes of Exercise per Session: 20 min  Stress: Stress Concern Present (01/30/2024)   [employer] of Occupational Health - Occupational Stress Questionnaire    Feeling of Stress : To some extent  Social Connections: Socially Integrated (01/30/2024)   Social Connection and Isolation Panel [NHANES]    Frequency of Communication with Friends and Family: More than three times a week    Frequency of Social Gatherings  with Friends and Family: More than three times a week    Attends Religious Services: More than 4 times per year    Active Member of [employer] or Organizations: Yes    Attends Engineer, structural: More than 4 times per year    Marital Status: Married    Tobacco Counseling Counseling given: No    Clinical Intake:  Pre-visit preparation completed: Yes  Pain : No/denies pain     BMI - recorded: 23.18 Nutritional Risks: None Diabetes: No  No results found for: "HGBA1C"   How often do you need to have someone help you when you read instructions, pamphlets, or other written materials from your doctor  or pharmacy?: 1 - Never  Interpreter Needed?: No  Information entered by :: Kandy Orris, CMA   Activities of Daily Living     01/30/2024    2:04 PM  In your present state of health, do you have any difficulty performing the following activities:  Hearing? 0  Vision? 0  Difficulty concentrating or making decisions? 0  Walking or climbing stairs? 0  Dressing or bathing? 0  Doing errands, shopping? 0  Preparing Food and eating ? N  Using the Toilet? N  In the past six months, have you accidently leaked urine? Y  Comment wears a pantyliner  Do you have problems with loss of bowel control? N  Managing your Medications? N  Managing your Finances? N  Housekeeping or managing your Housekeeping? N    Patient Care Team: Adelia Homestead, MD as PCP - General (Internal Medicine) Tami Falcon, MD as Consulting Physician (Gastroenterology) Murleen Arms, MD as Consulting Physician (Hematology and Oncology) Caralyn Chandler, MD as Consulting Physician (General Surgery) Retta Caster, MD as Consulting Physician (Radiation Oncology) Rudine Cos, MD as Consulting Physician (Ophthalmology)  Indicate any recent Medical Services you may have received from other than [facility] providers in the past year (date may be approximate).     Assessment:   This is a routine wellness  examination for Deborah Jones.  Hearing/Vision screen Hearing Screening - Comments:: Denies hearing difficulties   Vision Screening - Comments:: Wears rx glasses - up to date with routine eye exams with Dr Roslynn Coombes   Goals Addressed               This Visit's Progress     Patient Stated (pt-stated)        Patient stated she plans to exercise daily.       Depression Screen     01/30/2024    2:13 PM 03/28/2020    9:08 AM 06/03/2015   12:10 PM  PHQ 2/9 Scores  PHQ - 2 Score 0 0 0  PHQ- 9 Score 3      Fall Risk     01/30/2024    2:05 PM 10/01/2023    8:37 AM 06/06/2023    1:14 PM 03/28/2020    9:08 AM  Fall Risk   Falls in the past year? 0 0 0 0  Number falls in past yr: 0 0 0   Injury with Fall? 0 0 0   Risk for fall due to : No Fall Risks     Follow up Falls prevention discussed;Falls evaluation completed Falls evaluation completed Falls evaluation completed     MEDICARE RISK AT HOME:  Medicare Risk at Home Any stairs in or around the home?: No If so, are there any without handrails?: No Home free of loose throw rugs in walkways, pet beds, electrical cords, etc?: Yes Adequate lighting in your home to reduce risk of falls?: Yes Life alert?: No Use of a cane, walker or w/c?: No Grab bars in the bathroom?: No Shower chair or bench in shower?: No Elevated toilet seat or a handicapped toilet?: No  TIMED UP AND GO:  Was the test performed?  No  Cognitive Function: Declined/Normal: No cognitive concerns noted by patient or family. Patient alert, oriented, able to answer questions appropriately and recall recent events. No signs of memory loss or confusion.    01/30/2024    2:12 PM  MMSE - Mini Mental State Exam  Not completed: Refused        Immunizations Immunization  History  Administered Date(s) Administered   Influenza, Quadrivalent, Recombinant, Inj, Pf 06/14/2018, 05/21/2019, 07/02/2020   Influenza,inj,Quad PF,6+ Mos 06/13/2015, 07/06/2016, 07/08/2017    Influenza-Unspecified 06/01/2023   Pfizer(Comirnaty )Fall Seasonal Vaccine 12 years and older 03/15/2023   Unspecified SARS-COV-2 Vaccination 10/31/2019, 11/30/2019, 03/14/2021, 06/01/2023    Screening Tests Health Maintenance  Topic Date Due   DTaP/Tdap/Td (1 - Tdap) Never done   Zoster Vaccines- Shingrix (1 of 2) Never done   Pneumonia Vaccine 62+ Years old (1 of 1 - PCV) Never done   Colonoscopy  11/21/2023   COVID-19 Vaccine (6 - Mixed Product risk 2024-25 season) 11/29/2023   DEXA SCAN  01/04/2024   INFLUENZA VACCINE  05/01/2024   MAMMOGRAM  07/14/2024   Medicare Annual Wellness (AWV)  01/29/2025   Hepatitis C Screening  Completed   HPV VACCINES  Aged Out   Meningococcal B Vaccine  Aged Out    Health Maintenance  Health Maintenance Due  Topic Date Due   DTaP/Tdap/Td (1 - Tdap) Never done   Zoster Vaccines- Shingrix (1 of 2) Never done   Pneumonia Vaccine 35+ Years old (1 of 1 - PCV) Never done   Colonoscopy  11/21/2023   COVID-19 Vaccine (6 - Mixed Product risk 2024-25 season) 11/29/2023   DEXA SCAN  01/04/2024   Health Maintenance Items Addressed: Spouse stated the pt has a Cologuard test and plans to complete.  Spouse stated pt has had immunizations at local pharmacy and plans to get a report to update records.    DEXA scan - Pt plans to discuss w/PCP prior to scheduling at next appt.  Additional Screening:  Vision Screening: Recommended annual ophthalmology exams for early detection of glaucoma and other disorders of the eye.  Dental Screening: Recommended annual dental exams for proper oral hygiene  Community Resource Referral / Chronic Care Management: CRR required this visit?  No   CCM required this visit?  No     Plan:     I have personally reviewed and noted the following in the patient's chart:   Medical and social history Use of alcohol , tobacco or illicit drugs  Current medications and supplements including opioid prescriptions. Patient is  not currently taking opioid prescriptions. Functional ability and status Nutritional status Physical activity Advanced directives List of other physicians Hospitalizations, surgeries, and ER visits in previous 12 months Vitals Screenings to include cognitive, depression, and falls Referrals and appointments  In addition, I have reviewed and discussed with patient certain preventive protocols, quality metrics, and best practice recommendations. A written personalized care plan for preventive services as well as general preventive health recommendations were provided to patient.     Patria Bookbinder, CMA   01/30/2024   After Visit Summary: (MyChart) Due to this being a telephonic visit, the after visit summary with patients personalized plan was offered to patient via MyChart   Notes: Nothing significant to report at this time.

## 2024-01-30 NOTE — Patient Instructions (Addendum)
 Deborah Jones , Thank you for taking time to come for your Medicare Wellness Visit. I appreciate your ongoing commitment to your health goals. Please review the following plan we discussed and let me know if I can assist you in the future.   Referrals/Orders/Follow-Ups/Clinician Recommendations: Aim for 30 minutes of exercise or brisk walking, 6-8 glasses of water, and 5 servings of fruits and vegetables each day. Educated and advised on getting the DEXA Scan, completing the Cologuard test, and the Tdap (Tetenus), Shingles, Pneumonia, and COVID vaccines in 2025.  This is a list of the screening recommended for you and due dates:  Health Maintenance  Topic Date Due   DTaP/Tdap/Td vaccine (1 - Tdap) Never done   Zoster (Shingles) Vaccine (1 of 2) Never done   Pneumonia Vaccine (1 of 1 - PCV) Never done   Colon Cancer Screening  11/21/2023   COVID-19 Vaccine (6 - Mixed Product risk 2024-25 season) 11/29/2023   DEXA scan (bone density measurement)  01/04/2024   Flu Shot  05/01/2024   Mammogram  07/14/2024   Medicare Annual Wellness Visit  01/29/2025   Hepatitis C Screening  Completed   HPV Vaccine  Aged Out   Meningitis B Vaccine  Aged Out    Advanced directives: (Copy Requested) Please bring a copy of your health care power of attorney and living will to the office to be added to your chart at your convenience. You can mail to [facility] [address] [place], [place] [address] or email to [email or web address]  Next Medicare Annual Wellness Visit scheduled for next year: Yes

## 2024-02-12 ENCOUNTER — Encounter: Payer: Self-pay | Admitting: Internal Medicine

## 2024-02-12 ENCOUNTER — Ambulatory Visit (INDEPENDENT_AMBULATORY_CARE_PROVIDER_SITE_OTHER): Admitting: Internal Medicine

## 2024-02-12 VITALS — BP 122/74 | HR 63 | Temp 98.0°F | Ht 67.0 in | Wt 146.0 lb

## 2024-02-12 DIAGNOSIS — R410 Disorientation, unspecified: Secondary | ICD-10-CM

## 2024-02-12 DIAGNOSIS — R350 Frequency of micturition: Secondary | ICD-10-CM | POA: Diagnosis not present

## 2024-02-12 DIAGNOSIS — R300 Dysuria: Secondary | ICD-10-CM | POA: Diagnosis not present

## 2024-02-12 LAB — POC URINALSYSI DIPSTICK (AUTOMATED)
Bilirubin, UA: NEGATIVE
Glucose, UA: NEGATIVE
Ketones, UA: NEGATIVE
Leukocytes, UA: NEGATIVE
Nitrite, UA: NEGATIVE
Protein, UA: NEGATIVE
Spec Grav, UA: 1.025 (ref 1.010–1.025)
Urobilinogen, UA: 0.2 U/dL
pH, UA: 6 (ref 5.0–8.0)

## 2024-02-12 NOTE — Patient Instructions (Addendum)
    Initial urine looks good - no infection - will send for a culture.    Medications changes include :   None    Return if symptoms worsen or fail to improve.

## 2024-02-12 NOTE — Progress Notes (Signed)
 Subjective:    Patient ID: Deborah Jones, female    DOB: [birth year], 73 y.o.   MRN: [id number]      HPI Deborah Jones is here for  Chief Complaint  Patient presents with   Urinary Tract Infection  She is here with her husband who provides the history secondary to her presumed dementia.   Symptoms started about one week ago - having some disorientation.  Not sure of any obvious urine symptoms.  When I asked her she does state maybe some frequency and urgency, but has not really noted any thing different.     Medications and allergies reviewed with patient and updated if appropriate.  Current Outpatient Medications on File Prior to Visit  Medication Sig Dispense Refill   anastrozole  (ARIMIDEX ) 1 MG tablet Take 1 tablet (1 mg total) by mouth daily. 90 tablet 3   CALCIUM PO Take 1,000 mg by mouth.     Cholecalciferol (VITAMIN D3) 2000 UNITS TABS Take 2,000 Units by mouth daily.     Menatetrenone (VITAMIN K2) 100 MCG TABS Take 1 capsule by mouth daily.     No current facility-administered medications on file prior to visit.    Review of Systems  Constitutional:  Negative for appetite change, chills and fever.  HENT:  Negative for congestion and sore throat.   Respiratory:  Negative for cough, shortness of breath and wheezing.   Cardiovascular:  Positive for leg swelling (one ankle). Negative for chest pain and palpitations.  Gastrointestinal:  Negative for abdominal pain, constipation and diarrhea.  Genitourinary:  Positive for frequency and urgency. Negative for difficulty urinating, dysuria and hematuria.       No cloudy, malordorous urine  Skin:  Negative for rash.  Psychiatric/Behavioral:  Positive for confusion.        Objective:   Vitals:   02/12/24 1329  BP: 122/74  Pulse: 63  Temp: 98 F (36.7 C)  SpO2: 98%   BP Readings from Last 3 Encounters:  02/12/24 122/74  10/01/23 120/80  06/25/23 118/68   Wt Readings from Last 3 Encounters:  02/12/24 146 lb  (66.2 kg)  01/30/24 148 lb (67.1 kg)  10/01/23 148 lb (67.1 kg)   Body mass index is 22.87 kg/m.    Physical Exam Constitutional:      General: She is not in acute distress.    Appearance: Normal appearance. She is not ill-appearing.  HENT:     Head: Normocephalic and atraumatic.  Eyes:     Conjunctiva/sclera: Conjunctivae normal.  Cardiovascular:     Rate and Rhythm: Normal rate and regular rhythm.  Pulmonary:     Effort: Pulmonary effort is normal.     Breath sounds: Normal breath sounds.  Abdominal:     General: There is no distension.     Palpations: Abdomen is soft.     Tenderness: There is no abdominal tenderness.  Musculoskeletal:     Cervical back: Neck supple. No tenderness.     Right lower leg: Edema (Mild) present.     Left lower leg: Edema (Mild) present.  Skin:    General: Skin is warm and dry.     Findings: No rash.  Neurological:     Mental Status: She is alert.            Assessment & Plan:    Disorientation, urinary frequency: Last week she had an episode of disorientation for her husband and he was concerned about a possible UTI She has had urinary tract  infections in the past No obvious urine symptoms as far as her husband's concern maybe some frequency and urgency, but those are somewhat chronic Urine dip here negative for infection Will send urine for culture and will only treat if positive No other symptoms suggestive of infection or other obvious cause for disorientation Discussed disorientation may have just been ups and downs with presumed dementia  I will let them know what the urine culture shows Has been will call us  if any symptoms change or develop

## 2024-02-14 ENCOUNTER — Ambulatory Visit: Payer: Self-pay | Admitting: Internal Medicine

## 2024-02-14 LAB — CULTURE, URINE COMPREHENSIVE

## 2024-02-14 MED ORDER — CEPHALEXIN 500 MG PO CAPS: 500.0000 mg | ORAL_CAPSULE | Freq: Two times a day (BID) | ORAL | 0 refills | Status: DC

## 2024-02-18 ENCOUNTER — Ambulatory Visit: Payer: PPO | Admitting: Internal Medicine

## 2024-02-20 ENCOUNTER — Encounter: Payer: Self-pay | Admitting: Internal Medicine

## 2024-02-20 ENCOUNTER — Telehealth: Payer: Self-pay | Admitting: Internal Medicine

## 2024-02-20 NOTE — Telephone Encounter (Signed)
 Copied from CRM [id number]. Topic: General - Other >> Feb 20, 2024 10:19 AM Magdalene School wrote: Reason for CRM: Patient's husband is requesting a call back to discuss POA paperwork. [phone]

## 2024-02-21 NOTE — Telephone Encounter (Signed)
 To my understaing do they just need to bring in the form and have you fil out? How does this process work please advise

## 2024-02-21 NOTE — Telephone Encounter (Signed)
 I have sent another encounter message to provider in regards to this

## 2024-02-21 NOTE — Telephone Encounter (Signed)
I have scheduled the patient

## 2024-02-25 ENCOUNTER — Telehealth (INDEPENDENT_AMBULATORY_CARE_PROVIDER_SITE_OTHER): Admitting: Internal Medicine

## 2024-02-25 ENCOUNTER — Encounter: Payer: Self-pay | Admitting: Internal Medicine

## 2024-02-25 VITALS — Ht 67.0 in | Wt 140.0 lb

## 2024-02-25 DIAGNOSIS — G319 Degenerative disease of nervous system, unspecified: Secondary | ICD-10-CM | POA: Diagnosis not present

## 2024-02-25 DIAGNOSIS — H524 Presbyopia: Secondary | ICD-10-CM | POA: Diagnosis not present

## 2024-02-25 DIAGNOSIS — Z961 Presence of intraocular lens: Secondary | ICD-10-CM | POA: Diagnosis not present

## 2024-02-25 DIAGNOSIS — H04123 Dry eye syndrome of bilateral lacrimal glands: Secondary | ICD-10-CM | POA: Diagnosis not present

## 2024-02-25 DIAGNOSIS — H26493 Other secondary cataract, bilateral: Secondary | ICD-10-CM | POA: Diagnosis not present

## 2024-02-25 DIAGNOSIS — H52203 Unspecified astigmatism, bilateral: Secondary | ICD-10-CM | POA: Diagnosis not present

## 2024-02-25 NOTE — Progress Notes (Signed)
 Virtual Visit via Video Note  I connected with Deborah Jones on 02/25/24 at  9:00 AM EDT by a video enabled telemedicine application and verified that I am speaking with the correct person using two identifiers.  The patient and the provider were at separate locations throughout the entire encounter. Patient location: home, Provider location: work   I discussed the limitations of evaluation and management by telemedicine and the availability of in person appointments. The patient expressed understanding and agreed to proceed. The patient and the provider were the only parties present for the visit unless noted in HPI below.  History of Present Illness: The patient is a 73 y.o. female with visit for some confusion recently and did antibiotic for infection in urine. Some marginal changes. Working with lawyers on power of attorney. Husband does all household tasks and meal prep and she does not drive. Some issues with not wanting to change clothes and bathing.   Observations/Objective: Appearance: at baseline, breathing appears normal, casual grooming in pjs, mental status is not oriented to date, is oriented to person and place, unable to repeat back phrase or 3 objects  Assessment and Plan: See problem oriented charting  Follow Up Instructions: letter done with recommendations she not have capacity for financial decision making  I discussed the assessment and treatment plan with the patient. The patient was provided an opportunity to ask questions and all were answered. The patient agreed with the plan and demonstrated an understanding of the instructions.   The patient was advised to call back or seek an in-person evaluation if the symptoms worsen or if the condition fails to improve as anticipated.  Adelia Homestead, MD

## 2024-02-25 NOTE — Assessment & Plan Note (Signed)
 Progressive and more struggles with day to day. Letter done with assessment which is done today of her financial capacity. For healthcare matters as always we should involve patient as much as possible and she is able to say what she wants but is not able to understand some longer implications of actions or treatments so would need assistance to understand and make choices there.

## 2024-03-04 ENCOUNTER — Telehealth: Payer: Self-pay | Admitting: Internal Medicine

## 2024-03-04 NOTE — Telephone Encounter (Signed)
 Adult Day Care form left for completion - Placed in Dr. Constance Dellen box.

## 2024-03-06 ENCOUNTER — Telehealth: Payer: Self-pay | Admitting: Internal Medicine

## 2024-03-06 NOTE — Telephone Encounter (Signed)
 Placed in the office box

## 2024-03-06 NOTE — Telephone Encounter (Signed)
 Form has been received and under review

## 2024-03-06 NOTE — Telephone Encounter (Signed)
 Dr. Nicolette Barrio wrote a letter regarding patient's care. Per [employer], they need the provider signature on it. A copy of the letter was printed and placed in provider box for signature. Please call patient's husband Debborah Fairly at [phone] for pick up.

## 2024-03-09 NOTE — Telephone Encounter (Signed)
 This has been signed and will placed up front

## 2024-03-12 ENCOUNTER — Telehealth: Payer: Self-pay

## 2024-03-12 NOTE — Telephone Encounter (Signed)
 Verbally confirmed appt for 6/16

## 2024-03-16 ENCOUNTER — Telehealth: Payer: Self-pay | Admitting: Internal Medicine

## 2024-03-16 ENCOUNTER — Inpatient Hospital Stay: Payer: PPO | Attending: Hematology and Oncology | Admitting: Hematology and Oncology

## 2024-03-16 VITALS — BP 122/76 | HR 59 | Temp 97.9°F | Resp 17 | Ht 67.0 in | Wt 148.4 lb

## 2024-03-16 DIAGNOSIS — D0512 Intraductal carcinoma in situ of left breast: Secondary | ICD-10-CM | POA: Diagnosis not present

## 2024-03-16 DIAGNOSIS — Z170 Estrogen receptor positive status [ER+]: Secondary | ICD-10-CM | POA: Diagnosis not present

## 2024-03-16 DIAGNOSIS — Z79811 Long term (current) use of aromatase inhibitors: Secondary | ICD-10-CM | POA: Insufficient documentation

## 2024-03-16 DIAGNOSIS — Z803 Family history of malignant neoplasm of breast: Secondary | ICD-10-CM | POA: Insufficient documentation

## 2024-03-16 DIAGNOSIS — Z809 Family history of malignant neoplasm, unspecified: Secondary | ICD-10-CM | POA: Insufficient documentation

## 2024-03-16 MED ORDER — ANASTROZOLE 1 MG PO TABS: 1.0000 mg | ORAL_TABLET | Freq: Every day | ORAL | 3 refills | Status: AC

## 2024-03-16 NOTE — Progress Notes (Signed)
 [facility] CONSULT NOTE  Patient Care Team: Adelia Homestead, MD as PCP - General (Internal Medicine) Tami Falcon, MD as Consulting Physician (Gastroenterology) Murleen Arms, MD as Consulting Physician (Hematology and Oncology) Caralyn Chandler, MD as Consulting Physician (General Surgery) Retta Caster, MD as Consulting Physician (Radiation Oncology) Rudine Cos, MD as Consulting Physician (Ophthalmology)  CHIEF COMPLAINTS/PURPOSE OF CONSULTATION:  Newly diagnosed breast cancer  HISTORY OF PRESENTING ILLNESS:  Deborah Jones 73 y.o. female is here because of recent diagnosis of left breast cancer  I reviewed her records extensively and collaborated the history with the patient.  SUMMARY OF ONCOLOGIC HISTORY: Oncology History  Ductal carcinoma in situ (DCIS) of left breast  07/11/2022 Mammogram   Bilateral screening mammogram showed possible mass in the left breast warranting further evaluation.  No findings suspicious for malignancy in the right breast.  Diagnostic mammogram confirmed irregular hypoechoic mass in the left breast at 4:00 Axis II centimeters from the nipple measuring 4 mm with internal vascularity corresponding to the mammographic finding.  Ultrasound-guided biopsy recommended to exclude malignancy.   07/20/2022 Breast US    Left breast ultrasound showed left breast mass at 4:00 axis measuring 4 mm corresponding to the mammographic finding.   07/27/2022 Pathology Results   Pathology from the breast biopsy showed DCIS, low-grade involving a papillary lesion. Prognostics showed ER 100% positive strong staining intensity PR 80% positive strong staining intensity   08/08/2022 Cancer Staging   Staging form: Breast, AJCC 8th Edition - Clinical stage from 08/08/2022: Stage 0 (cTis (DCIS), cN0, cM0, ER+, PR+) - Signed by Murleen Arms, MD on 03/16/2024 Stage prefix: Initial diagnosis Nuclear grade: G1 Laterality: Left Staged by: Pathologist and  managing physician Stage used in treatment planning: Yes National guidelines used in treatment planning: Yes Type of national guideline used in treatment planning: NCCN   08/2022 -  Anti-estrogen oral therapy   Anastrozole     Deborah Jones is here for follow-up.   Since her last visit here, she continues on anastrozole , denies any new side effects.  She continues to follow-up with her other providers.  She denies any hospitalizations.  No falls.  She has seen neurology in the past for suspicion of parkinsonism however it appears that there was no conclusive diagnosis about this.  She continues to have the tremor, slow stride/short step stride, confusion, masklike facies.  Rest of the pertinent 10 point ROS reviewed and negative  MEDICAL HISTORY:  Past Medical History:  Diagnosis Date   Breast cancer (HCC)    Cataract    Lens replacement   Endometriosis    GERD (gastroesophageal reflux disease)    Hypothyroidism    Kidney stone    Low back pain    STARTED IN JULY 2012   Osteopenia 08/2017   T score -1.3 FRAX 7% / 0.7%   Rosacea     SURGICAL HISTORY: Past Surgical History:  Procedure Laterality Date   ABDOMINAL HYSTERECTOMY     TAH/ WITH BILATERAL SALPINGECTOMIES AND  RIGHT OOPHORECTOMY    BREAST BIOPSY  08/29/2022   MM LT RADIOACTIVE SEED LOC MAMMO GUIDE 08/29/2022 [facility] MAMMOGRAPHY   BREAST LUMPECTOMY Left 08/30/2022   BREAST LUMPECTOMY WITH RADIOACTIVE SEED LOCALIZATION Left 08/30/2022   Procedure: LEFT BREAST LUMPECTOMY WITH RADIOACTIVE SEED LOCALIZATION;  Surgeon: Caralyn Chandler, MD;  Location: [facility];  Service: General;  Laterality: Left;   EYE SURGERY  08/2019   Cataracts Bilateral eyes   OOPHORECTOMY     RSO  US  ECHOCARDIOGRAPHY  04/01/2009   EF 55-60%    SOCIAL HISTORY: Social History   Socioeconomic History   Marital status: Married    Spouse name: Not on file   Number of children: Not on file   Years of education: Not on file    Highest education level: Master's degree (e.g., MA, MS, MEng, MEd, MSW, MBA)  Occupational History   Occupation: retired    Comment: Teacher, early years/pre at [employer]  Tobacco Use   Smoking status: Never    Passive exposure: Never   Smokeless tobacco: Never  Vaping Use   Vaping status: Never Used  Substance and Sexual Activity   Alcohol  use: Not Currently   Drug use: No   Sexual activity: Not Currently    Partners: Male    Birth control/protection: Surgical    Comment: 1st intercourse 73 yo-Fewer than 5 partners, hysterectomy  Other Topics Concern   Not on file  Social History Narrative   Right handed    Social Drivers of Health   Financial Resource Strain: Low Risk  (01/30/2024)   Overall Financial Resource Strain (CARDIA)    Difficulty of Paying Living Expenses: Not hard at all  Food Insecurity: No Food Insecurity (01/30/2024)   Hunger Vital Sign    Worried About Running Out of Food in the Last Year: Never true    Ran Out of Food in the Last Year: Never true  Transportation Needs: No Transportation Needs (01/30/2024)   PRAPARE - Administrator, Civil Service (Medical): No    Lack of Transportation (Non-Medical): No  Physical Activity: Insufficiently Active (01/30/2024)   Exercise Vital Sign    Days of Exercise per Week: 2 days    Minutes of Exercise per Session: 20 min  Stress: Stress Concern Present (01/30/2024)   [employer] of Occupational Health - Occupational Stress Questionnaire    Feeling of Stress : To some extent  Social Connections: Socially Integrated (01/30/2024)   Social Connection and Isolation Panel    Frequency of Communication with Friends and Family: More than three times a week    Frequency of Social Gatherings with Friends and Family: More than three times a week    Attends Religious Services: More than 4 times per year    Active Member of [employer] or Organizations: Yes    Attends Engineer, structural: More than 4 times per year    Marital Status:  Married  Catering manager Violence: Not At Risk (01/30/2024)   Humiliation, Afraid, Rape, and Kick questionnaire    Fear of Current or Ex-Partner: No    Emotionally Abused: No    Physically Abused: No    Sexually Abused: No    FAMILY HISTORY: Family History  Problem Relation Age of Onset   Hypertension Mother    Asthma Mother    Atrial fibrillation Father    Colon cancer Maternal Grandmother        dx 62s   Cancer Maternal Grandmother    Other Maternal Grandfather        brain tumor; dx 61s   Cancer Maternal Grandfather     ALLERGIES:  has no known allergies.  MEDICATIONS:  Current Outpatient Medications  Medication Sig Dispense Refill   anastrozole  (ARIMIDEX ) 1 MG tablet Take 1 tablet (1 mg total) by mouth daily. 90 tablet 3   CALCIUM PO Take 1,000 mg by mouth.     Cholecalciferol (VITAMIN D3) 2000 UNITS TABS Take 2,000 Units by mouth daily.  Menatetrenone (VITAMIN K2) 100 MCG TABS Take 1 capsule by mouth daily.     No current facility-administered medications for this visit.    REVIEW OF SYSTEMS:   Constitutional: Denies fevers, chills or abnormal night sweats Eyes: Denies blurriness of vision, double vision or watery eyes Ears, nose, mouth, throat, and face: Denies mucositis or sore throat Respiratory: Denies cough, dyspnea or wheezes Cardiovascular: Denies palpitation, chest discomfort or lower extremity swelling Gastrointestinal:  Denies nausea, heartburn or change in bowel habits Skin: Denies abnormal skin rashes Lymphatics: Denies new lymphadenopathy or easy bruising Neurological:Denies numbness, tingling or new weaknesses Behavioral/Psych: Mood is stable, no new changes  Breast:  Denies any palpable lumps or discharge All other systems were reviewed with the patient and are negative.  PHYSICAL EXAMINATION: ECOG PERFORMANCE STATUS: 0 - Asymptomatic  Vitals:   03/16/24 1325  BP: 122/76  Pulse: (!) 59  Resp: 17  Temp: 97.9 F (36.6 C)  SpO2: 100%    Filed Weights   03/16/24 1325  Weight: 148 lb 6.4 oz (67.3 kg)    GENERAL:alert, no distress and comfortable Neurological: Mask facies, pill-rolling tremor. Chest: Bilateral breasts inspected and palpated.  There was some tenderness in the left breast lower quadrant but no definitive palpable mass.  No regional adenopathy  LABORATORY DATA:  I have reviewed the data as listed Lab Results  Component Value Date   WBC 5.0 08/08/2022   HGB 12.9 08/08/2022   HCT 37.4 08/08/2022   MCV 93.5 08/08/2022   PLT 183 08/08/2022   Lab Results  Component Value Date   NA 139 08/08/2022   K 4.2 08/08/2022   CL 106 08/08/2022   CO2 28 08/08/2022    RADIOGRAPHIC STUDIES: I have personally reviewed the radiological reports and agreed with the findings in the report.  ASSESSMENT AND PLAN:   Ductal carcinoma in situ (DCIS) of left breast This is a very pleasant 73 year old postmenopausal female patient with newly diagnosed left breast DCIS involving a papillary lesion, low-grade, ER/PR strongly positive referred to breast MDC for additional recommendations.  We have discussed the following details about DCIS. She is now here status post lumpectomy, no residual DCIS noted on the final pathology.  She is on anastrozole  for antiestrogen therapy, tolerating it extremely well.  She is tolerating anastrozole  well.  She will be due for her mammogram in October, this has been ordered.  She has some tenderness in the left breast lower quadrant but no palpable mass.  She and her husband were clearly instructed to keep me posted if the tenderness persists.  They expressed understanding.  She she otherwise continues to follow-up with her other specialist for concern of parkinsonism although this was not established.  Encourage follow-up with neurologist as recommended.  I will see her back in 1 year or sooner as needed.    Total time spent: 20  minutes including history, physical exam, review of records,  counseling and coordination of care All questions were answered. The patient knows to call the clinic with any problems, questions or concerns.    Murleen Arms, MD 03/16/24

## 2024-03-16 NOTE — Telephone Encounter (Signed)
 Copied from CRM [id number]. Topic: General - Other >> Mar 16, 2024 11:31 AM Marlan Silva wrote: Reason for CRM: Patients husband Debborah Fairly called in requesting the status on some paper work that he states he dropped off on behalf of the patient. The forms are for a assistant daycare program. Patient can be reached at [phone].

## 2024-03-16 NOTE — Assessment & Plan Note (Signed)
 This is a very pleasant 73 year old postmenopausal female patient with newly diagnosed left breast DCIS involving a papillary lesion, low-grade, ER/PR strongly positive referred to breast MDC for additional recommendations.  We have discussed the following details about DCIS. She is now here status post lumpectomy, no residual DCIS noted on the final pathology.  She is on anastrozole  for antiestrogen therapy, tolerating it extremely well.  She is tolerating anastrozole  well.  She will be due for her mammogram in October, this has been ordered.  She has some tenderness in the left breast lower quadrant but no palpable mass.  She and her husband were clearly instructed to keep me posted if the tenderness persists.  They expressed understanding.  She she otherwise continues to follow-up with her other specialist for concern of parkinsonism although this was not established.  Encourage follow-up with neurologist as recommended.  I will see her back in 1 year or sooner as needed.

## 2024-03-17 ENCOUNTER — Telehealth: Payer: Self-pay

## 2024-03-17 NOTE — Telephone Encounter (Signed)
 Copied from CRM [id number]. Topic: Medical Record Request - Provider/Facility Request >> Mar 17, 2024 11:41 AM Antonieta Kitten wrote: Reason for CRM: Deborah Jones from [facility] calling back to state that this form is a different form that need to be complete and corrected.In the box there are 2 question are, it was marked that patient do not has dementia. Requesting for form to be re faxed with correct information. Direct contact number is [phone]

## 2024-03-17 NOTE — Telephone Encounter (Signed)
 Copied from CRM [id number]. Topic: General - Other >> Mar 16, 2024 11:31 AM Marlan Silva wrote: Reason for CRM: Patients husband Debborah Fairly called in requesting the status on some paper work that he states he dropped off on behalf of the patient. The forms are for a assistant daycare program. Patient can be reached at [phone]. >> Mar 17, 2024  9:31 AM Howard Macho wrote: Patient husband Debborah Fairly) called stating he is checking on form that he dropped off for the patient

## 2024-03-17 NOTE — Telephone Encounter (Signed)
 Provider is not in office until next week. I do have a form that we have already filled out back in April and have sent over. I will fax this over along with the current form that is exactly the same I will stamp and send over as well. If needing physical signature from provider it will have to wait or I can ask another provider if they are willing to sign only appropriate. I have also called twice to the well care facility but unable to speak to someone due to the option that are limited. Im unsure who is in charge of the patient. Forms have been fax

## 2024-03-18 ENCOUNTER — Telehealth: Payer: Self-pay

## 2024-03-18 NOTE — Telephone Encounter (Signed)
 I have sent a message to her neurologist and asked if she has diagnosed her with dementia I cannot tell if she has in her notes with the patient.

## 2024-03-18 NOTE — Telephone Encounter (Signed)
 I have updated and corrected paper work for the adult day care and have re fax this back over

## 2024-03-25 ENCOUNTER — Telehealth: Payer: Self-pay

## 2024-03-25 ENCOUNTER — Encounter: Payer: Self-pay | Admitting: Internal Medicine

## 2024-03-25 ENCOUNTER — Ambulatory Visit (INDEPENDENT_AMBULATORY_CARE_PROVIDER_SITE_OTHER): Payer: PPO | Admitting: Internal Medicine

## 2024-03-25 VITALS — HR 78 | Temp 98.3°F | Ht 67.0 in | Wt 147.0 lb

## 2024-03-25 DIAGNOSIS — G319 Degenerative disease of nervous system, unspecified: Secondary | ICD-10-CM | POA: Diagnosis not present

## 2024-03-25 DIAGNOSIS — G214 Vascular parkinsonism: Secondary | ICD-10-CM | POA: Insufficient documentation

## 2024-03-25 NOTE — Telephone Encounter (Signed)
 During patient visit today. The husband was upset about paperwork for adult care that has been signed and send back. I have communicated with the facility multiple times. The paper work was wrong twice due to a box not being  marked which I did correct and the stamped signature of the provider not being dated which I corrected and sent back. The patient husband was very hostile when I first brought them back in the room and demanded that I give him a copy since he was told by the facility that they never received it  which there is documentation it is. After giving the patient his copy and leaving the room he proceeds to walk out after me and rudely asked where I was because I was at my desk which he could not see me from where he was standing. I proceeded to walk out to where he could see me and mr haskin walked up to me in anger and in my personal space and started to raise his voice demanding that I give the copy I sent to the facility with dated stamped signature. I had to ask him twice to go back in the room and I can have the provider sign the form since she was back in office and make him a copy of that. He was still demanding the form while standing in my face rasing his voice

## 2024-03-25 NOTE — Progress Notes (Signed)
   Subjective:   Patient ID: Deborah Jones, female    DOB: 1951/09/25, 73 y.o.   MRN: [id number]  HPI The patient is a 73 YO female coming in for follow up.   Review of Systems  Unable to perform ROS: Dementia    Objective:  Physical Exam Constitutional:      Appearance: She is well-developed.  HENT:     Head: Normocephalic and atraumatic.     Comments: Wearing two pairs of glasses  Cardiovascular:     Rate and Rhythm: Normal rate and regular rhythm.  Pulmonary:     Effort: Pulmonary effort is normal. No respiratory distress.     Breath sounds: Normal breath sounds. No wheezing or rales.  Abdominal:     General: Bowel sounds are normal. There is no distension.     Palpations: Abdomen is soft.     Tenderness: There is no abdominal tenderness. There is no rebound.   Musculoskeletal:     Cervical back: Normal range of motion.   Skin:    General: Skin is warm and dry.   Neurological:     Mental Status: She is alert. Mental status is at baseline.     Coordination: Coordination normal.     Vitals:   03/25/24 0849  Pulse: 78  Temp: 98.3 F (36.8 C)  TempSrc: Oral  SpO2: 99%  Weight: 147 lb (66.7 kg)  Height: 5' 7 (1.702 m)    Assessment & Plan:  Visit time 10 minutes in face to face communication with patient and coordination of care, additional 10 minutes spent in record review, coordination or care, ordering tests, communicating/referring to other healthcare professionals, documenting in medical records all on the same day of the visit for total time 20 minutes spent on the visit.

## 2024-03-25 NOTE — Assessment & Plan Note (Signed)
 Overall stable and papers filled out for her care.

## 2024-03-25 NOTE — Assessment & Plan Note (Signed)
 Papers are filled out again for husband and he is given copy during visit to help facilitate her getting the care she needs.

## 2024-04-01 ENCOUNTER — Telehealth: Payer: Self-pay

## 2024-04-01 NOTE — Telephone Encounter (Signed)
 I called & spoke to patients husband regarding his wife needing  an appointment for a uti. DPR signed. He stated he spoke to someone yesterday & they decided they would go to the ER to be seen because they were not able to do any of the times available. I told him to call us  back to make a follow up appointment if needed. He agreed.

## 2024-04-02 ENCOUNTER — Ambulatory Visit (HOSPITAL_COMMUNITY): Payer: Self-pay

## 2024-04-05 ENCOUNTER — Ambulatory Visit (HOSPITAL_COMMUNITY)
Admission: RE | Admit: 2024-04-05 | Discharge: 2024-04-05 | Disposition: A | Discharge status: Home / Self Care | Source: Ambulatory Visit | Attending: Emergency Medicine | Admitting: Emergency Medicine

## 2024-04-05 ENCOUNTER — Encounter (HOSPITAL_COMMUNITY): Payer: Self-pay

## 2024-04-05 VITALS — BP 117/75 | HR 62 | Temp 97.5°F | Resp 18 | Ht 67.0 in | Wt 147.0 lb

## 2024-04-05 DIAGNOSIS — N3001 Acute cystitis with hematuria: Secondary | ICD-10-CM | POA: Diagnosis not present

## 2024-04-05 DIAGNOSIS — B356 Tinea cruris: Secondary | ICD-10-CM | POA: Insufficient documentation

## 2024-04-05 LAB — POCT URINALYSIS DIP (MANUAL ENTRY)
Bilirubin, UA: NEGATIVE
Glucose, UA: NEGATIVE mg/dL
Ketones, POC UA: NEGATIVE mg/dL
Nitrite, UA: POSITIVE — AB
Spec Grav, UA: 1.02 (ref 1.010–1.025)
Urobilinogen, UA: 0.2 U/dL
pH, UA: 6.5 (ref 5.0–8.0)

## 2024-04-05 MED ORDER — CEPHALEXIN 500 MG PO CAPS: 500.0000 mg | ORAL_CAPSULE | Freq: Two times a day (BID) | ORAL | 0 refills | Status: AC

## 2024-04-05 MED ORDER — NYSTATIN 100000 UNIT/GM EX POWD: 1.0000 | Freq: Three times a day (TID) | CUTANEOUS | 0 refills | Status: DC

## 2024-04-05 NOTE — ED Provider Notes (Signed)
 [facility]    CSN: [id number] Arrival date & time: 04/05/24  1037      History   Chief Complaint Chief Complaint  Patient presents with   Appointment   Groin Pain    HPI Deborah Jones is a 73 y.o. female.   Patient presents with husband for cloudy urine that began earlier this morning.  Patient and has been denies dysuria, hematuria, urinary frequency/urgency, abdominal pain, flank pain, fever, confusion, and weakness.  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  Husband states that patient does have a caregiver that comes by the house and checks on her about every 8 hours daily. However, husband states that there was a 3-day period where patient's pad had not been changed.  The history is provided by the spouse, the patient and medical records.  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   Microscopic hematuria 07/05/2016   Vaginal atrophy 06/24/2012   Osteopenia 06/24/2012    Past Surgical History:  Procedure Laterality Date   ABDOMINAL HYSTERECTOMY     TAH/ WITH BILATERAL SALPINGECTOMIES AND  RIGHT OOPHORECTOMY    BREAST BIOPSY  08/29/2022   MM LT RADIOACTIVE SEED LOC MAMMO GUIDE 08/29/2022 [facility]   BREAST LUMPECTOMY Left 08/30/2022   BREAST LUMPECTOMY WITH RADIOACTIVE SEED LOCALIZATION Left 08/30/2022   Procedure: LEFT BREAST LUMPECTOMY WITH  RADIOACTIVE SEED LOCALIZATION;  Surgeon: Curvin Deward MOULD, MD;  Location: [facility];  Service: General;  Laterality: Left;   EYE SURGERY  08/2019   Cataracts Bilateral eyes   OOPHORECTOMY     RSO   US  ECHOCARDIOGRAPHY  04/01/2009   EF 55-60%    OB History     Gravida  0   Para      Term      Preterm      AB      Living         SAB      IAB      Ectopic      Multiple      Live Births               Home Medications    Prior to Admission medications   Medication Sig Start Date End Date Taking? Authorizing Provider  anastrozole  (ARIMIDEX ) 1 MG tablet Take 1 tablet (1 mg total) by mouth daily. 03/16/24  Yes Iruku, Praveena, MD  CALCIUM PO Take 1,000 mg by mouth.   Yes [provider]  cephALEXin  (KEFLEX ) 500 MG capsule Take 1 capsule (500 mg total) by mouth 2 (two) times daily for 7 days. 04/05/24 04/12/24 Yes Asma Boldon A, NP  Cholecalciferol (VITAMIN D3) 2000 UNITS TABS Take 2,000 Units by mouth daily.   Yes [provider]  Menatetrenone (  VITAMIN K2) 100 MCG TABS Take 1 capsule by mouth daily. 02/01/22  Yes [provider]  nystatin  (MYCOSTATIN /NYSTOP ) powder Apply 1 Application topically 3 (three) times daily. 04/05/24  Yes Johnie Rumaldo LABOR, NP    Family History Family History  Problem Relation Age of Onset   Hypertension Mother    Asthma Mother    Atrial fibrillation Father    Colon cancer Maternal Grandmother        dx 19s   Cancer Maternal Grandmother    Other Maternal Grandfather        brain tumor; dx 49s   Cancer Maternal Grandfather     Social History Social History   Tobacco Use   Smoking status: Never    Passive exposure: Never   Smokeless tobacco: Never  Vaping Use   Vaping status: Never Used  Substance Use Topics   Alcohol  use: Not Currently   Drug use: No     Allergies   Patient has no known allergies.   Review of Systems Review of Systems  Per HPI  Physical Exam Triage  Vital Signs ED Triage Vitals  Encounter Vitals Group     BP 04/05/24 1103 117/75     Girls Systolic BP Percentile --      Girls Diastolic BP Percentile --      Boys Systolic BP Percentile --      Boys Diastolic BP Percentile --      Pulse Rate 04/05/24 1103 62     Resp 04/05/24 1103 18     Temp 04/05/24 1103 (!) 97.5 F (36.4 C)     Temp Source 04/05/24 1103 Oral     SpO2 04/05/24 1103 95 %     Weight 04/05/24 1103 147 lb 0.8 oz (66.7 kg)     Height 04/05/24 1103 5' 7 (1.702 m)     Head Circumference --      Peak Flow --      Pain Score 04/05/24 1102 4     Pain Loc --      Pain Education --      Exclude from Growth Chart --    No data found.  Updated Vital Signs BP 117/75 (BP Location: Right Arm)   Pulse 62   Temp (!) 97.5 F (36.4 C) (Oral)   Resp 18   Ht 5' 7 (1.702 m)   Wt 147 lb 0.8 oz (66.7 kg)   SpO2 95%   BMI 23.03 kg/m   Visual Acuity Right Eye Distance:   Left Eye Distance:   Bilateral Distance:    Right Eye Near:   Left Eye Near:    Bilateral Near:     Physical Exam Vitals and nursing note reviewed.  Constitutional:      General: She is awake. She is not in acute distress.    Appearance: Normal appearance. She is well-developed and well-groomed. She is not ill-appearing.  Abdominal:     General: Abdomen is flat. Bowel sounds are normal.     Palpations: Abdomen is soft.     Tenderness: There is no abdominal tenderness. There is no right CVA tenderness or left CVA tenderness.  Skin:    General: Skin is warm and dry.     Findings: Erythema and rash present.     Comments: Beefy erythematous rash with excoriation noted to bilateral groin region  Neurological:     Mental Status: She is alert. Mental status is at baseline.     GCS: GCS eye subscore is  4. GCS verbal subscore is 4. GCS motor subscore is 6.  Psychiatric:        Behavior: Behavior is cooperative.      UC Treatments / Results  Labs (all labs ordered are listed, but only abnormal  results are displayed) Labs Reviewed  POCT URINALYSIS DIP (MANUAL ENTRY) - Abnormal; Notable for the following components:      Result Value   Color, UA orange (*)    Clarity, UA cloudy (*)    Blood, UA small (*)    Protein Ur, POC trace (*)    Nitrite, UA Positive (*)    Leukocytes, UA Large (3+) (*)    All other components within normal limits  URINE CULTURE    EKG   Radiology No results found.  Procedures Procedures (including critical care time)  Medications Ordered in UC Medications - No data to display  Initial Impression / Assessment and Plan / UC Course  I have reviewed the triage vital signs and the nursing notes.  Pertinent labs & imaging results that were available during my care of the patient were reviewed by me and considered in my medical decision making (see chart for details).     Patient is overall well-appearing.  Vitals are stable.  GCS 14 did delay to mildly confused verbal response.  Husband reports that this is consistent with baseline.  Upon assessment there is a beefy erythematous rash with excoriation noted to bilateral groin region appears to be consistent with tinea cruris.   Urinalysis reveals positive nitrites, large leukocytes, small RBCs, and trace protein, will send urine culture to confirm presence of urinary tract infection.  Empirically treating with cephalexin  for acute cystitis.  Prescribed nystatin  powder for tinea cruris coverage.  Discussed follow-up, return, and strict ER precautions. Final Clinical Impressions(s) / UC Diagnoses   Final diagnoses:  Acute cystitis with hematuria  Tinea cruris     Discharge Instructions      Start taking cephalexin  twice daily for 7 days for urinary tract infection coverage. Apply nystatin  powder 3 times daily to the area between your legs to cover for yeast and help with irritation. We have sent a urine culture and these results will come back in the next few days and someone will call if  results require adjustment in treatment. Follow-up with your primary care provider or return here as needed. If she develops fever, worsening symptoms, severe weakness, or altered mental status please take her to the emergency department for further evaluation.   ED Prescriptions     Medication Sig Dispense Auth. Provider   nystatin  (MYCOSTATIN /NYSTOP ) powder Apply 1 Application topically 3 (three) times daily. 15 g Johnie Flaming A, NP   cephALEXin  (KEFLEX ) 500 MG capsule Take 1 capsule (500 mg total) by mouth 2 (two) times daily for 7 days. 14 capsule Johnie Flaming A, NP      PDMP not reviewed this encounter.   Johnie Flaming A, NP 04/05/24 1255

## 2024-04-05 NOTE — ED Triage Notes (Signed)
 Patient presenting with painful rash between her legs, cloudy urine onset last night.  Prescriptions or OTC medications tried: No

## 2024-04-05 NOTE — Discharge Instructions (Signed)
 Start taking cephalexin  twice daily for 7 days for urinary tract infection coverage. Apply nystatin  powder 3 times daily to the area between your legs to cover for yeast and help with irritation. We have sent a urine culture and these results will come back in the next few days and someone will call if results require adjustment in treatment. Follow-up with your primary care provider or return here as needed. If she develops fever, worsening symptoms, severe weakness, or altered mental status please take her to the emergency department for further evaluation.

## 2024-04-08 LAB — URINE CULTURE: Culture: 50000 — AB

## 2024-04-09 ENCOUNTER — Ambulatory Visit (HOSPITAL_COMMUNITY): Payer: Self-pay

## 2024-04-11 ENCOUNTER — Ambulatory Visit (HOSPITAL_COMMUNITY)
Admission: RE | Admit: 2024-04-11 | Discharge: 2024-04-11 | Disposition: A | Discharge status: Home / Self Care | Payer: Self-pay | Source: Ambulatory Visit | Attending: Internal Medicine | Admitting: Internal Medicine

## 2024-04-11 VITALS — BP 140/83 | HR 59 | Temp 98.1°F | Resp 18

## 2024-04-11 DIAGNOSIS — N390 Urinary tract infection, site not specified: Secondary | ICD-10-CM | POA: Diagnosis not present

## 2024-04-11 DIAGNOSIS — Z8659 Personal history of other mental and behavioral disorders: Secondary | ICD-10-CM | POA: Insufficient documentation

## 2024-04-11 LAB — POCT URINALYSIS DIP (MANUAL ENTRY)
Bilirubin, UA: NEGATIVE
Blood, UA: NEGATIVE
Glucose, UA: NEGATIVE mg/dL
Ketones, POC UA: NEGATIVE mg/dL
Leukocytes, UA: NEGATIVE
Nitrite, UA: NEGATIVE
Protein Ur, POC: NEGATIVE mg/dL
Spec Grav, UA: 1.02 (ref 1.010–1.025)
Urobilinogen, UA: 0.2 U/dL
pH, UA: 7 (ref 5.0–8.0)

## 2024-04-11 NOTE — ED Provider Notes (Signed)
 [facility]    CSN: [id number] Arrival date & time: 04/11/24  1242      History   Chief Complaint Chief Complaint  Patient presents with   Urinary Frequency    Tested for UTI a week ago at urgent care (Dr Johnie). Last pill is tomorrow AM. Showing signs of increased loss of mental focus / disorientation. - Entered by patient    HPI Deborah Jones is a 73 y.o. female.   73 year old female who was brought to urgent care by her husband for follow-up after urinary tract infection treatment.  Husband reports that she finished her antibiotics and currently has no urinary symptoms but last night he felt that she was a little bit fuzzy mentally.  Today she has actually returned to her baseline.  She denies any urinary symptoms, fevers, chills, nausea, vomiting.  She is eating and drinking well.   Urinary Frequency Pertinent negatives include no chest pain, no abdominal pain and no shortness of breath.    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   Microscopic hematuria 07/05/2016   Vaginal atrophy 06/24/2012   Osteopenia 06/24/2012    Past Surgical History:  Procedure Laterality Date   ABDOMINAL HYSTERECTOMY     TAH/ WITH BILATERAL SALPINGECTOMIES AND  RIGHT OOPHORECTOMY    BREAST BIOPSY  08/29/2022   MM LT RADIOACTIVE SEED LOC MAMMO GUIDE 08/29/2022 [facility]   BREAST LUMPECTOMY Left 08/30/2022   BREAST LUMPECTOMY WITH RADIOACTIVE SEED LOCALIZATION Left  08/30/2022   Procedure: LEFT BREAST LUMPECTOMY WITH RADIOACTIVE SEED LOCALIZATION;  Surgeon: Curvin Deward MOULD, MD;  Location: [facility];  Service: General;  Laterality: Left;   EYE SURGERY  08/2019   Cataracts Bilateral eyes   OOPHORECTOMY     RSO   US  ECHOCARDIOGRAPHY  04/01/2009   EF 55-60%    OB History     Gravida  0   Para      Term      Preterm      AB      Living         SAB      IAB      Ectopic      Multiple      Live Births               Home Medications    Prior to Admission medications   Medication Sig Start Date End Date Taking? Authorizing Provider  anastrozole  (ARIMIDEX ) 1 MG tablet Take 1 tablet (1 mg total) by mouth daily. 03/16/24  Yes Iruku, Praveena, MD  CALCIUM PO Take 1,000 mg by mouth.   Yes [provider]  cephALEXin  (KEFLEX ) 500 MG capsule Take 1 capsule (500 mg total) by mouth 2 (two) times daily for 7 days. 04/05/24 04/12/24 Yes Johnie Flaming A, NP  Cholecalciferol (VITAMIN D3) 2000 UNITS TABS Take 2,000 Units  by mouth daily.   Yes [provider]  Menatetrenone (VITAMIN K2) 100 MCG TABS Take 1 capsule by mouth daily. 02/01/22  Yes [provider]  nystatin  (MYCOSTATIN /NYSTOP ) powder Apply 1 Application topically 3 (three) times daily. 04/05/24  Yes Johnie Rumaldo LABOR, NP    Family History Family History  Problem Relation Age of Onset   Hypertension Mother    Asthma Mother    Atrial fibrillation Father    Colon cancer Maternal Grandmother        dx 91s   Cancer Maternal Grandmother    Other Maternal Grandfather        brain tumor; dx 15s   Cancer Maternal Grandfather     Social History Social History   Tobacco Use   Smoking status: Never    Passive exposure: Never   Smokeless tobacco: Never  Vaping Use   Vaping status: Never Used  Substance Use Topics   Alcohol  use: Not Currently   Drug use: No     Allergies   Patient has no known allergies.   Review of  Systems Review of Systems  Constitutional:  Negative for chills and fever.  HENT:  Negative for ear pain and sore throat.   Eyes:  Negative for pain and visual disturbance.  Respiratory:  Negative for cough and shortness of breath.   Cardiovascular:  Negative for chest pain and palpitations.  Gastrointestinal:  Negative for abdominal pain and vomiting.  Genitourinary:  Negative for dysuria, frequency, hematuria and urgency.  Musculoskeletal:  Negative for arthralgias and back pain.  Skin:  Negative for color change and rash.  Neurological:  Negative for seizures and syncope.  Psychiatric/Behavioral:         Mild mental status change last night that has resolved this morning  All other systems reviewed and are negative.    Physical Exam Triage Vital Signs ED Triage Vitals  Encounter Vitals Group     BP 04/11/24 1304 (!) 140/83     Girls Systolic BP Percentile --      Girls Diastolic BP Percentile --      Boys Systolic BP Percentile --      Boys Diastolic BP Percentile --      Pulse Rate 04/11/24 1304 (!) 59     Resp 04/11/24 1304 18     Temp 04/11/24 1304 98.1 F (36.7 C)     Temp Source 04/11/24 1304 Oral     SpO2 04/11/24 1304 97 %     Weight --      Height --      Head Circumference --      Peak Flow --      Pain Score 04/11/24 1309 0     Pain Loc --      Pain Education --      Exclude from Growth Chart --    No data found.  Updated Vital Signs BP (!) 140/83 (BP Location: Left Arm)   Pulse (!) 59   Temp 98.1 F (36.7 C) (Oral)   Resp 18   SpO2 97%   Visual Acuity Right Eye Distance:   Left Eye Distance:   Bilateral Distance:    Right Eye Near:   Left Eye Near:    Bilateral Near:     Physical Exam Vitals and nursing note reviewed.  Constitutional:      General: She is not in acute distress.    Appearance: She is well-developed.  HENT:     Head: Normocephalic and atraumatic.  Eyes:     Conjunctiva/sclera: Conjunctivae normal.  Cardiovascular:      Rate and Rhythm: Normal rate and regular rhythm.     Heart sounds: No murmur heard. Pulmonary:     Effort: Pulmonary effort is normal. No respiratory distress.     Breath sounds: Normal breath sounds.  Abdominal:     Palpations: Abdomen is soft.     Tenderness: There is no abdominal tenderness.  Musculoskeletal:        General: No swelling.     Cervical back: Neck supple.  Skin:    General: Skin is warm and dry.     Capillary Refill: Capillary refill takes less than 2 seconds.  Neurological:     Mental Status: She is alert. Mental status is at baseline.     Comments: At baseline per the husband  Psychiatric:        Mood and Affect: Mood normal.      UC Treatments / Results  Labs (all labs ordered are listed, but only abnormal results are displayed) Labs Reviewed  URINE CULTURE  POCT URINALYSIS DIP (MANUAL ENTRY)    EKG   Radiology No results found.  Procedures Procedures (including critical care time)  Medications Ordered in UC Medications - No data to display  Initial Impression / Assessment and Plan / UC Course  I have reviewed the triage vital signs and the nursing notes.  Pertinent labs & imaging results that were available during my care of the patient were reviewed by me and considered in my medical decision making (see chart for details).     Lower urinary tract infectious disease  Mental status change resolved   The urinalysis done today is normal, UTI appears resolved. Unsure what caused the mild mental status change last night but reassuringly, this has resolved and the remainder of the physical exam does not show any significant red flags. For now, monitor symptoms and return to urgent care if these return.   Final Clinical Impressions(s) / UC Diagnoses   Final diagnoses:  Lower urinary tract infectious disease  Mental status change resolved     Discharge Instructions      The urinalysis done today is normal, UTI appears resolved. Unsure  what caused the mild mental status change last night but reassuringly, this has resolved and the remainder of the physical exam does not show any significant red flags. For now, monitor symptoms and return to urgent care if these return.     ED Prescriptions   None    PDMP not reviewed this encounter.   Teresa Almarie LABOR, PA-C 04/11/24 1417

## 2024-04-11 NOTE — Discharge Instructions (Addendum)
 The urinalysis done today is normal, UTI appears resolved. Unsure what caused the mild mental status change last night but reassuringly, this has resolved and the remainder of the physical exam does not show any significant red flags. For now, monitor symptoms and return to urgent care if these return.

## 2024-04-11 NOTE — ED Triage Notes (Signed)
 Patient presents with follow-up on urinary frequency. Patient c/o urinary symptoms last night. Husband is speaking for [place] today.

## 2024-04-12 LAB — URINE CULTURE: Culture: NO GROWTH

## 2024-04-13 ENCOUNTER — Ambulatory Visit (HOSPITAL_COMMUNITY): Payer: Self-pay

## 2024-06-06 ENCOUNTER — Encounter: Payer: Self-pay | Admitting: Internal Medicine

## 2024-06-12 ENCOUNTER — Other Ambulatory Visit: Payer: Self-pay

## 2024-06-12 ENCOUNTER — Other Ambulatory Visit (HOSPITAL_COMMUNITY): Payer: Self-pay

## 2024-06-12 MED ORDER — COVID-19 MRNA VAC-TRIS(PFIZER) 30 MCG/0.3ML IM SUSY
0.3000 mL | PREFILLED_SYRINGE | Freq: Once | INTRAMUSCULAR | 0 refills | Status: AC
  Filled 2024-06-12: qty 0.3, 1d supply, fill #0

## 2024-06-12 NOTE — Telephone Encounter (Signed)
Pharmacy is on file

## 2024-06-12 NOTE — Telephone Encounter (Signed)
 Copied from CRM [id number]. Topic: Clinical - Request for Lab/Test Order >> Jun 12, 2024  8:36 AM Franky GRADE wrote: Reason for CRM: Patient is requesting an order for the covid vaccine pfizer to be sent to [facility] at [facility].

## 2024-06-15 ENCOUNTER — Telehealth: Admitting: Internal Medicine

## 2024-06-15 ENCOUNTER — Encounter: Payer: Self-pay | Admitting: Internal Medicine

## 2024-06-15 ENCOUNTER — Other Ambulatory Visit (HOSPITAL_COMMUNITY): Payer: Self-pay

## 2024-06-15 DIAGNOSIS — G4709 Other insomnia: Secondary | ICD-10-CM | POA: Diagnosis not present

## 2024-06-15 MED ORDER — TRAZODONE HCL 50 MG PO TABS
50.0000 mg | ORAL_TABLET | Freq: Every day | ORAL | 0 refills | Status: AC
Start: 2024-06-15 — End: ?

## 2024-06-15 MED ORDER — FLUZONE HIGH-DOSE 0.5 ML IM SUSY
0.5000 mL | PREFILLED_SYRINGE | Freq: Once | INTRAMUSCULAR | 0 refills | Status: AC
  Filled 2024-06-15: qty 0.5, 1d supply, fill #0

## 2024-06-15 NOTE — Progress Notes (Unsigned)
 Virtual Visit via Video Note  I connected with Deborah Jones on 06/15/24 at  3:40 PM EDT by a video enabled telemedicine application and verified that I am speaking with the correct person using two identifiers.  The patient and the provider were at separate locations throughout the entire encounter. Patient location: {}, Provider location: work   I discussed the limitations of evaluation and management by telemedicine and the availability of in person appointments. The patient expressed understanding and agreed to proceed. The patient and the provider were the only parties present for the visit unless noted in HPI below.  History of Present Illness: Discussed the use of AI scribe software for clinical note transcription with the patient, who gave verbal consent to proceed.  History of Present Illness     Observations/Objective: Appearance: {}, breathing appears {}, {} grooming, abdomen {} appear distended, throat {}, memory {}, mental status is {}  Assessment and Plan Assessment & Plan      Follow Up Instructions: {}  I discussed the assessment and treatment plan with the patient. The patient was provided an opportunity to ask questions and all were answered. The patient agreed with the plan and demonstrated an understanding of the instructions.   The patient was advised to call back or seek an in-person evaluation if the symptoms worsen or if the condition fails to improve as anticipated.  Deborah DELENA [place], MD

## 2024-06-16 DIAGNOSIS — G4700 Insomnia, unspecified: Secondary | ICD-10-CM | POA: Insufficient documentation

## 2024-06-16 NOTE — Assessment & Plan Note (Signed)
 Rx trazodone  50 mg at bedtime and can increase if needed.

## 2024-06-23 ENCOUNTER — Ambulatory Visit: Payer: Self-pay

## 2024-06-23 NOTE — Telephone Encounter (Signed)
 FYI Only or Action Required?: FYI only for provider.  Patient was last seen in primary care on 06/15/2024 by Rollene Almarie LABOR, MD.  Called Nurse Triage reporting No chief complaint on file..  Symptoms began several weeks ago.  Interventions attempted: Nothing.  Symptoms are: unchanged.  Triage Disposition: See PCP When Office is Open (Within 3 Days)  Patient/caregiver understands and will follow disposition?: yes        Copied from CRM [id number]. Topic: Clinical - Red Word Triage >> Jun 23, 2024 10:27 AM Deborah Jones wrote: Red Word that prompted transfer to Nurse Triage: Patient having swelling in both legs Reason for Disposition  [1] MILD swelling of both ankles (i.e., pedal edema) AND [2] new-onset or getting worse  Answer Assessment - Initial Assessment Questions 1. ONSET: When did the swelling start? (e.g., minutes, hours, days)     Several weeks ago  2. LOCATION: What part of the leg is swollen?  Are both legs swollen or just one leg?     Feet and ankle  3. SEVERITY: How bad is the swelling? (e.g., localized; mild, moderate, severe)     Mild  4. REDNESS: Is there redness or signs of infection?     none 5. PAIN: Is the swelling painful to touch? If Yes, ask: How painful is it?   (Scale 1-10; mild, moderate or severe)     none 6. FEVER: Do you have a fever? If Yes, ask: What is it, how was it measured, and when did it start?      none 7. CAUSE: What do you think is causing the leg swelling?     age 20. MEDICAL HISTORY: Do you have a history of blood clots (e.g., DVT), cancer, heart failure, kidney disease, or liver failure?     dementia  10. OTHER SYMPTOMS: Do you have any other symptoms? (e.g., chest pain, difficulty breathing)       No  Protocols used: Leg Swelling and Edema-A-AH

## 2024-06-24 ENCOUNTER — Ambulatory Visit: Admitting: Internal Medicine

## 2024-06-24 ENCOUNTER — Encounter: Payer: Self-pay | Admitting: Internal Medicine

## 2024-06-24 ENCOUNTER — Other Ambulatory Visit (HOSPITAL_COMMUNITY): Payer: Self-pay

## 2024-06-24 ENCOUNTER — Telehealth: Admitting: Internal Medicine

## 2024-06-24 DIAGNOSIS — F03918 Unspecified dementia, unspecified severity, with other behavioral disturbance: Secondary | ICD-10-CM

## 2024-06-24 MED ORDER — ALPRAZOLAM 0.25 MG PO TABS: 0.2500 mg | ORAL_TABLET | Freq: Two times a day (BID) | ORAL | 0 refills | Status: DC | PRN

## 2024-06-24 NOTE — Assessment & Plan Note (Signed)
 Symptoms of agitation, hostility, and aggression occur mainly in the mornings and evenings, likely due to resistance to changing incontinence products and unrelated to urinary discomfort or infection. Prescribe mild anxiolytic alprazolam  0.25 mg PRN for these symptoms. Instruct on crushing medication for easier administration with applesauce or liquid. Advise monitoring medication effectiveness and report for adjustments

## 2024-06-24 NOTE — Progress Notes (Signed)
 Virtual Visit via Video Note  I connected with Deborah Jones on 06/24/24 at  8:40 AM EDT by a video enabled telemedicine application and verified that I am speaking with the correct person using two identifiers.  The patient and the provider were at separate locations throughout the entire encounter. Patient location: home, Provider location: work   I discussed the limitations of evaluation and management by telemedicine and the availability of in person appointments. The patient expressed understanding and agreed to proceed. The patient and the provider were the only parties present for the visit unless noted in HPI below.  History of Present Illness: Discussed the use of AI scribe software for clinical note transcription with the patient, who gave verbal consent to proceed.  History of Present Illness Deborah Jones is a 73 year old female who presents with urinary incontinence and concerns about a potential UTI.  She experiences urinary incontinence, which is particularly severe in the mornings and evenings, with some improvement during the middle of the day. She has frequent urination and sometimes remains in wet garments for extended periods due to dementia and not wanting to change. No discomfort during urination is reported. She does get agitated when she is made to change. Husband helps provide most of history.   She has difficulty swallowing pills but can manage small ones or those that can be crushed and mixed with food or liquid. She is currently taking a sleeping pill and vitamin D among other medications.    Observations/Objective: Appearance: normal  Assessment and Plan Assessment & Plan Behavioral symptoms (agitation, hostility, aggression)  associated with dementia Symptoms of agitation, hostility, and aggression occur mainly in the mornings and evenings, likely due to resistance to changing incontinence products and unrelated to urinary discomfort or infection. Prescribe  mild anxiolytic alprazolam  0.25 mg PRN for these symptoms. Instruct on crushing medication for easier administration with applesauce or liquid. Advise monitoring medication effectiveness and report for adjustments.  Follow Up Instructions: rx alprazolam  0.25 mg bid prn for agitation  I discussed the assessment and treatment plan with the patient. The patient was provided an opportunity to ask questions and all were answered. The patient agreed with the plan and demonstrated an understanding of the instructions.   The patient was advised to call back or seek an in-person evaluation if the symptoms worsen or if the condition fails to improve as anticipated.  Deborah DELENA [place], MD

## 2024-06-25 ENCOUNTER — Encounter: Payer: Self-pay | Admitting: Internal Medicine

## 2024-06-26 ENCOUNTER — Encounter (HOSPITAL_COMMUNITY): Payer: Self-pay

## 2024-06-26 ENCOUNTER — Ambulatory Visit (HOSPITAL_COMMUNITY)
Admission: EM | Admit: 2024-06-26 | Discharge: 2024-06-26 | Disposition: A | Discharge status: Home / Self Care | Attending: Physician Assistant | Admitting: Physician Assistant

## 2024-06-26 DIAGNOSIS — R600 Localized edema: Secondary | ICD-10-CM | POA: Diagnosis not present

## 2024-06-26 DIAGNOSIS — L03119 Cellulitis of unspecified part of limb: Secondary | ICD-10-CM

## 2024-06-26 HISTORY — DX: Unspecified dementia, unspecified severity, without behavioral disturbance, psychotic disturbance, mood disturbance, and anxiety: F03.90

## 2024-06-26 MED ORDER — SULFAMETHOXAZOLE-TRIMETHOPRIM 800-160 MG PO TABS: 1.0000 | ORAL_TABLET | Freq: Two times a day (BID) | ORAL | 0 refills | Status: AC

## 2024-06-26 MED ORDER — NITROFURANTOIN MONOHYD MACRO 100 MG PO CAPS: 100.0000 mg | ORAL_CAPSULE | Freq: Two times a day (BID) | ORAL | 0 refills | Status: DC
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
## 2024-06-26 NOTE — Discharge Instructions (Addendum)
 You were seen today for concerns of bilateral lower extremity swelling.  At this time I suspect that this is due to an accumulation of fluid in your lower legs.  This usually improves with compression socks and walking, elevating the feet when they are more swollen. You also had some redness along her toes as well as increased warmth that makes me concerned for a superficial skin infection.  I am changing your antibiotic that you were started on, Macrobid , to a different antibiotic that treats for skin infections as well as UTIs.  Please take the Bactrim  by mouth twice per day for 7 days.  If you feel like the swelling or redness is getting worse or if you experience more severe pain in your feet or legs please go to the emergency room as you may need further evaluation.   Please try to wear compression stockings. Use your shoe size and then measure around the widest part of your calf to get the appropriate size stocking I typically recommend 15-25 mmHg compression force for daily wear. Please put these on first thing in the morning and wear until the evening time.  These should not be painful or cut into your legs but they may be pretty snug. If they cause pain, please take them off.

## 2024-06-26 NOTE — ED Notes (Signed)
 Husband reported on arrival Pt was incontinent of stool . Husband reported her care giver was available to clean her . Deborah Jones took Pt to room 9 .

## 2024-06-26 NOTE — ED Provider Notes (Signed)
 [facility]    CSN: [id number] Arrival date & time: 06/26/24  1631      History   Chief Complaint Chief Complaint  Patient presents with   Foot Pain    HPI Deborah Jones is a 73 y.o. female.  has a past medical history of Breast cancer (HCC), Cataract, Dementia (HCC), Endometriosis, GERD (gastroesophageal reflux disease), Hypothyroidism, Kidney stone, Low back pain, Osteopenia (08/2017), and Rosacea.   HPI  Pt is here with her husband who is providing majority of HPI due to pt's mental status He expresses concerns for bilateral foot swelling and states this has been ongoing for a few weeks but today she started to complain of pain. She denies pain currently even with palpation of her feet. 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   Dementia with behavioral disturbance (HCC) 10/01/2023   Stress incontinence 10/01/2023   Tremor 10/01/2023   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   Microscopic hematuria 07/05/2016   Vaginal atrophy 06/24/2012   Osteopenia 06/24/2012    Past Surgical History:  Procedure Laterality Date   ABDOMINAL HYSTERECTOMY     TAH/ WITH BILATERAL SALPINGECTOMIES AND  RIGHT OOPHORECTOMY    BREAST BIOPSY  08/29/2022   MM LT RADIOACTIVE SEED LOC MAMMO GUIDE 08/29/2022 [facility]   BREAST LUMPECTOMY Left 08/30/2022   BREAST LUMPECTOMY WITH RADIOACTIVE SEED LOCALIZATION Left 08/30/2022   Procedure: LEFT BREAST LUMPECTOMY WITH RADIOACTIVE SEED  LOCALIZATION;  Surgeon: Curvin Deward MOULD, MD;  Location: [facility];  Service: General;  Laterality: Left;   EYE SURGERY  08/2019   Cataracts Bilateral eyes   OOPHORECTOMY     RSO   US  ECHOCARDIOGRAPHY  04/01/2009   EF 55-60%    OB History     Gravida  0   Para      Term      Preterm      AB      Living         SAB      IAB      Ectopic      Multiple      Live Births               Home Medications    Prior to Admission medications   Medication Sig Start Date End Date Taking? Authorizing Provider  sulfamethoxazole -trimethoprim  (BACTRIM  DS) 800-160 MG tablet Take 1 tablet by mouth 2 (two) times daily for 7 days. 06/26/24 07/03/24 Yes Teja Costen E, PA-C  ALPRAZolam  (XANAX ) 0.25 MG tablet Take 1 tablet (0.25 mg total) by mouth 2 (two) times daily as needed for anxiety. 06/24/24   Rollene Almarie LABOR, MD  anastrozole  (ARIMIDEX ) 1 MG tablet Take 1 tablet (1 mg total) by mouth daily. 03/16/24   Iruku, Praveena, MD  CALCIUM PO Take 1,000 mg by mouth.    [provider]  Cholecalciferol (VITAMIN D3) 2000 UNITS TABS Take 2,000 Units by mouth daily.    [provider]  Menatetrenone (VITAMIN K2) 100 MCG TABS Take 1 capsule by mouth daily. 02/01/22   [provider]  nystatin  (MYCOSTATIN /NYSTOP ) powder Apply 1 Application topically 3 (three) times daily. Patient not taking: Reported on 06/26/2024 04/05/24   Johnie Flaming A, NP  traZODone  (DESYREL ) 50 MG tablet Take 1 tablet (50 mg total) by mouth at bedtime. 06/15/24   Rollene Almarie LABOR, MD    Family History Family History  Problem Relation Age of Onset   Hypertension Mother    Asthma Mother    Atrial fibrillation Father    Colon cancer Maternal Grandmother        dx 56s   Cancer Maternal Grandmother    Other Maternal Grandfather        brain tumor; dx 70s   Cancer Maternal Grandfather     Social History Social History   Tobacco Use   Smoking status: Never     Passive exposure: Never   Smokeless tobacco: Never  Vaping Use   Vaping status: Never Used  Substance Use Topics   Alcohol  use: Not Currently   Drug use: No     Allergies   Patient has no known allergies.   Review of Systems Review of Systems  Constitutional:  Negative for chills and fever.  Respiratory:  Negative for chest tightness and shortness of breath.   Cardiovascular:  Positive for leg swelling. Negative for chest pain and palpitations.  Genitourinary:  Negative for dysuria.  Psychiatric/Behavioral:  Positive for agitation and confusion.      Physical Exam Triage Vital Signs ED Triage Vitals [06/26/24 1655]  Encounter Vitals Group     BP 134/85     Girls Systolic BP Percentile      Girls Diastolic BP Percentile      Boys Systolic BP Percentile      Boys Diastolic BP Percentile      Pulse Rate 82     Resp 16     Temp 98.2 F (36.8 C)     Temp Source Oral     SpO2 97 %     Weight      Height      Head Circumference      Peak Flow      Pain Score      Pain Loc      Pain Education      Exclude from Growth Chart    No data found.  Updated Vital Signs BP 134/85 (BP Location: Left Arm)   Pulse 82   Temp 98.2 F (36.8 C) (Oral)   Resp 16   SpO2 97%   Visual Acuity Right Eye Distance:   Left Eye Distance:   Bilateral Distance:    Right Eye Near:   Left Eye Near:    Bilateral Near:     Physical Exam Constitutional:      General: She is awake. She is not in acute distress.    Appearance: Normal appearance. She is well-developed and well-groomed. She is not ill-appearing, toxic-appearing or diaphoretic.  Cardiovascular:     Pulses:          Dorsalis pedis pulses are 2+ on the right side and 2+ on the left side.       Posterior tibial pulses are 2+ on the right side and 2+ on the left side.     Comments: LLE has 2+ pitting edema to the  level of mid shin  RLE has 1+ pitting edema to level of just above the ankle   Musculoskeletal:     Right  lower leg: 1+ Pitting Edema present.     Left lower leg: 2+ Pitting Edema present.  Feet:     Right foot:     Skin integrity: Erythema and warmth present. No ulcer, blister, skin breakdown, callus, dry skin or fissure.     Left foot:     Skin integrity: Erythema and warmth present. No ulcer, blister, skin breakdown, callus, dry skin or fissure.     Comments: Redness along the tips of multiple toes - blanching and no evidence of tenderness.  Cap refill <2 seconds at distal aspects of multiple toes  There is discoloration along the dorsal aspect of the right foot- appears bruised Pt has numerous spider veins along lower legs and ankles   Neurological:     Mental Status: She is alert.  Psychiatric:        Attention and Perception: She is inattentive.        Mood and Affect: Mood and affect normal.        Speech: Speech normal.        Behavior: Behavior is cooperative.     Comments: Pt is cooperative with provider during exam. Nursing staff reports agitation with vital collection       UC Treatments / Results  Labs (all labs ordered are listed, but only abnormal results are displayed) Labs Reviewed - No data to display  EKG   Radiology No results found.  Procedures Procedures (including critical care time)  Medications Ordered in UC Medications - No data to display  Initial Impression / Assessment and Plan / UC Course  I have reviewed the triage vital signs and the nursing notes.  Pertinent labs & imaging results that were available during my care of the patient were reviewed by me and considered in my medical decision making (see chart for details).      Final Clinical Impressions(s) / UC Diagnoses   Final diagnoses:  Cellulitis of lower extremity, unspecified laterality  Bilateral lower extremity edema   Patient is here today with her husband.  She has a previous history of dementia and her husband is providing the majority of HPI.  He states that over the last  few weeks she has had swelling in the bilateral lower extremities particularly in the feet.  He also reports concerns for increased agitation and confusion.  I reviewed several of her recent messages between patient's husband and PCP office as well as recent telemedicine visit on 06/24/2024.  Patient's husband reports that her PCP recently started her on Macrobid  for presumptive UTI given recent symptoms.  He states that getting a urine sample will be very difficult as patient becomes combative with adult diaper changes and bathroom assistance.  Physical exam is notable for 2+ pitting edema on the left lower leg to the level of mid shin and 1+ pitting edema to just above the right ankle.  She does have significant swelling along the dorsal aspects of both feet.  She also has significant redness at the distal aspects of most of her toes.  This does blanch and she does not show evidence of discomfort with palpation but the skin does feel warm compared to surrounding tissue.  She appears to be neurovascularly intact with normal pulses bilaterally and intact cap refill.  At this time I am concerned for potential cellulitis given redness, increased warmth  and patient reports that she was complaining of foot pain earlier today.  Will DC Macrobid  for UTI and start Bactrim  to assist with concern for cellulitis and UTI coverage.  Recommend use of compression socks to assist with swelling and  elevation of the legs to further assist with reducing swelling.  Recommend close follow-up with PCP for ongoing management and monitoring.  ED and return precautions reviewed and provided in AVS.  Follow-up as needed.    Discharge Instructions      You were seen today for concerns of bilateral lower extremity swelling.  At this time I suspect that this is due to an accumulation of fluid in your lower legs.  This usually improves with compression socks and walking, elevating the feet when they are more swollen. You also had some  redness along her toes as well as increased warmth that makes me concerned for a superficial skin infection.  I am changing your antibiotic that you were started on, Macrobid , to a different antibiotic that treats for skin infections as well as UTIs.  Please take the Bactrim  by mouth twice per day for 7 days.  If you feel like the swelling or redness is getting worse or if you experience more severe pain in your feet or legs please go to the emergency room as you may need further evaluation.   Please try to wear compression stockings. Use your shoe size and then measure around the widest part of your calf to get the appropriate size stocking I typically recommend 15-25 mmHg compression force for daily wear. Please put these on first thing in the morning and wear until the evening time.  These should not be painful or cut into your legs but they may be pretty snug. If they cause pain, please take them off.        ED Prescriptions     Medication Sig Dispense Auth. Provider   sulfamethoxazole -trimethoprim  (BACTRIM  DS) 800-160 MG tablet Take 1 tablet by mouth 2 (two) times daily for 7 days. 14 tablet Makynlie Rossini E, PA-C      PDMP not reviewed this encounter.   Marylene Rocky BRAVO, PA-C 06/26/24 1819

## 2024-06-26 NOTE — Telephone Encounter (Signed)
**Note De-identified  [place] Obfuscation** Please advise 

## 2024-06-29 NOTE — Telephone Encounter (Signed)
 FYI

## 2024-07-01 MED ORDER — QUETIAPINE FUMARATE 50 MG PO TABS: 50.0000 mg | ORAL_TABLET | Freq: Every day | ORAL | 0 refills | Status: DC | PRN

## 2024-07-01 NOTE — Addendum Note (Signed)
 Addended by: ROLLENE NORRIS A on: 07/01/2024 10:37 AM   Modules accepted: Orders

## 2024-07-15 ENCOUNTER — Encounter

## 2024-07-16 ENCOUNTER — Ambulatory Visit
Admission: RE | Admit: 2024-07-16 | Discharge: 2024-07-16 | Disposition: A | Discharge status: Home / Self Care | Source: Ambulatory Visit | Attending: Hematology and Oncology | Admitting: Hematology and Oncology

## 2024-07-16 DIAGNOSIS — D0512 Intraductal carcinoma in situ of left breast: Secondary | ICD-10-CM

## 2024-07-28 ENCOUNTER — Other Ambulatory Visit: Payer: Self-pay | Admitting: Internal Medicine

## 2024-09-04 ENCOUNTER — Encounter: Payer: Self-pay | Admitting: Internal Medicine

## 2024-09-08 ENCOUNTER — Ambulatory Visit: Payer: Self-pay

## 2024-09-08 NOTE — Telephone Encounter (Signed)
 FYI Only or Action Required?: FYI only for provider: appointment scheduled on 09/09/24.  Patient was last seen in primary care on 06/24/2024 by Rollene Almarie LABOR, MD.  Called Nurse Triage reporting Dementia (/).  Symptoms began several weeks ago.  Interventions attempted: Prescription medications: Seroquel .  Symptoms are: gradually worsening.  Triage Disposition: See Physician Within 24 Hours  Patient/caregiver understands and will follow disposition?: Yes    Copied from CRM #[id number]. Topic: Clinical - Red Word Triage >> Sep 08, 2024  3:39 PM Deborah Jones wrote: Red Word that prompted transfer to Nurse Triage: Thinks it is side effects to QUEtiapine  (SEROQUEL ) 50 MG tablet. Increased disorientation, mood changes, odd behaviors, combative to where it impacts home health workers. Reason for Disposition  [1] Confusion getting worse AND [2] slow onset (days to weeks)  Answer Assessment - Initial Assessment Questions Spoke with pts husband Deborah Jones (on [place]) reporting pt has had gradual increase in agitation and confusion over the past few weeks. Denies hallucination or fever. Pt has hx of dementia with confusion/agitation at baseline. Tried xanax  in the past, not helpful. PCP prescribed Seroquel  on 10/29 for increased agitation and disorientation. Tried 1/2 dose and full dose, neither helpful. Has appt scheduled with PCP 12/16 to fill out forms for pt to go to residential care center on 12/17. Would like to get pt agitation and confusion into a better state before going to care center. PCP [facility] through 12/15. Scheduled appt with different provider at home office tomorrow.   1. MAIN CONCERN OR SYMPTOM:  What is your main concern right now? What questions do you have? What's the main symptom you're worried about? (e.g., confusion, memory loss)     Worsening agitation and confusion over the past few weeks. Has been found on the floor while taking the full dose of Seroquel  d/t increased confusion,  likely did not hit head. Generally is not a fall risk and has good balance/walks well.  2. ONSET:  When did the symptom start (or worsen)? (minutes, hours, days, weeks)     Worse over the past few weeks  3. BETTER-SAME-WORSE: Are you (the patient) getting better, staying the same, or getting worse compared to the day you (they) were diagnosed or most recent hospital discharge?     Worse  4. DIAGNOSIS: Was the dementia diagnosed by a doctor? If Yes, ask: When? (e.g., days, months, years ago)     Dementia several years ago  5. MEDICINES: Has there been any change in medicines recently? (e.g., narcotics, antihistamines, benzodiazepines, etc.)     Seroquel , prescribed 10/29  6. OTHER SYMPTOMS: Are there any other symptoms? (e.g., cough, falling, fever, pain)     Denies  7. SUPPORT: What type of support do you (the patient) have? Note: Document living circumstances and support (e.g., family, nursing home).     Husband and home health  Protocols used: Dementia Symptoms and Questions-A-AH

## 2024-09-09 ENCOUNTER — Ambulatory Visit: Payer: Self-pay | Admitting: Internal Medicine

## 2024-09-09 ENCOUNTER — Telehealth: Payer: Self-pay | Admitting: Internal Medicine

## 2024-09-09 ENCOUNTER — Ambulatory Visit: Admitting: Internal Medicine

## 2024-09-09 ENCOUNTER — Encounter: Payer: Self-pay | Admitting: Internal Medicine

## 2024-09-09 VITALS — BP 124/78 | HR 83 | Temp 97.7°F | Resp 16 | Ht 67.0 in | Wt 137.0 lb

## 2024-09-09 DIAGNOSIS — F03918 Unspecified dementia, unspecified severity, with other behavioral disturbance: Secondary | ICD-10-CM

## 2024-09-09 DIAGNOSIS — E039 Hypothyroidism, unspecified: Secondary | ICD-10-CM | POA: Insufficient documentation

## 2024-09-09 DIAGNOSIS — G214 Vascular parkinsonism: Secondary | ICD-10-CM

## 2024-09-09 LAB — CBC WITH DIFFERENTIAL/PLATELET
Basophils Absolute: 0 K/uL (ref 0.0–0.1)
Basophils Relative: 0.2 % (ref 0.0–3.0)
Eosinophils Absolute: 0 K/uL (ref 0.0–0.7)
Eosinophils Relative: 0.5 % (ref 0.0–5.0)
HCT: 37.4 % (ref 36.0–46.0)
Hemoglobin: 12.6 g/dL (ref 12.0–15.0)
Lymphocytes Relative: 25.4 % (ref 12.0–46.0)
Lymphs Abs: 1.7 K/uL (ref 0.7–4.0)
MCHC: 33.6 g/dL (ref 30.0–36.0)
MCV: 93.3 fl (ref 78.0–100.0)
Monocytes Absolute: 0.4 K/uL (ref 0.1–1.0)
Monocytes Relative: 6 % (ref 3.0–12.0)
Neutro Abs: 4.6 K/uL (ref 1.4–7.7)
Neutrophils Relative %: 67.9 % (ref 43.0–77.0)
Platelets: 238 K/uL (ref 150.0–400.0)
RBC: 4.01 Mil/uL (ref 3.87–5.11)
RDW: 14.2 % (ref 11.5–15.5)
WBC: 6.8 K/uL (ref 4.0–10.5)

## 2024-09-09 LAB — HEPATIC FUNCTION PANEL
ALT: 29 U/L (ref 0–35)
AST: 39 U/L — ABNORMAL HIGH (ref 0–37)
Albumin: 4.1 g/dL (ref 3.5–5.2)
Alkaline Phosphatase: 77 U/L (ref 39–117)
Bilirubin, Direct: 0.1 mg/dL (ref 0.0–0.3)
Total Bilirubin: 0.6 mg/dL (ref 0.2–1.2)
Total Protein: 7.1 g/dL (ref 6.0–8.3)

## 2024-09-09 LAB — BASIC METABOLIC PANEL WITH GFR
BUN: 18 mg/dL (ref 6–23)
CO2: 28 meq/L (ref 19–32)
Calcium: 9.4 mg/dL (ref 8.4–10.5)
Chloride: 107 meq/L (ref 96–112)
Creatinine, Ser: 0.7 mg/dL (ref 0.40–1.20)
GFR: 86.66 mL/min (ref >60.00)
Glucose, Bld: 94 mg/dL (ref 70–99)
Potassium: 3.8 meq/L (ref 3.5–5.1)
Sodium: 141 meq/L (ref 135–145)

## 2024-09-09 LAB — TSH: TSH: 2.29 u[IU]/mL (ref 0.35–5.50)

## 2024-09-09 MED ORDER — BREXPIPRAZOLE 2 MG PO TABS: 2.0000 mg | ORAL_TABLET | Freq: Every day | ORAL | Status: DC

## 2024-09-09 MED ORDER — BREXPIPRAZOLE 1 MG PO TABS: 1.0000 mg | ORAL_TABLET | Freq: Every day | ORAL | Status: DC

## 2024-09-09 MED ORDER — REXULTI 0.5 MG PO TABS: 1.0000 | ORAL_TABLET | Freq: Every day | ORAL | Status: AC

## 2024-09-09 NOTE — Telephone Encounter (Signed)
 Forms placed up front in Dr. Rogena box for Appt. On the 10th.

## 2024-09-09 NOTE — Progress Notes (Signed)
 Subjective:  Patient ID: Deborah Jones, female    DOB: October 10, 1950  Age: 73 y.o. MRN: [id number]  CC: Altered Mental Status (Patient has a history of dementia. Medication not helping the way that it should be. )   HPI Deborah Jones presents for f/up  ----  Discussed the use of AI scribe software for clinical note transcription with the patient, who gave verbal consent to proceed.  History of Present Illness Deborah Jones is a 73 year old female with dementia who presents with agitation and confusion despite Seroquel  treatment.  She experiences agitation and confusion, particularly when asked to perform tasks she does not want to do, such as morning clean-up with caregivers. This behavior has been problematic for the caregivers, raising concerns about potentially losing them due to these issues.  Her diagnosis is dementia, though it is not specified as Alzheimer's, vascular, or frontal lobe dementia. A neurologist specializing in Parkinson's disease has been involved in her care.  Her current medication regimen includes Seroquel , which is not effectively managing her symptoms of agitation. Prior to Seroquel , she was on Xanax , but it was ineffective as the 'adrenaline just kind of overrode' its effects. She is also taking anastrozole  following a lobectomy.  There have been a couple of minor falls when she was taking a full pill of Seroquel , but these were not traumatic and occurred at night. She requires assistance with eating, dressing, and drinking.  Her thyroid  levels have been checked intermittently over the years, and she was on low-level thyroid  medication, but she is not currently taking it. The last thyroid  check was approximately a year and a half ago.  No weight loss, coughing, wheezing, chest pain, abdominal pain, nausea, or vomiting.     Outpatient Medications Prior to Visit  Medication Sig Dispense Refill   anastrozole  (ARIMIDEX ) 1 MG tablet Take 1 tablet (1 mg  total) by mouth daily. 90 tablet 3   CALCIUM PO Take 1,000 mg by mouth.     Cholecalciferol (VITAMIN D3) 2000 UNITS TABS Take 2,000 Units by mouth daily.     Menatetrenone (VITAMIN K2) 100 MCG TABS Take 1 capsule by mouth daily.     traZODone  (DESYREL ) 50 MG tablet Take 1 tablet (50 mg total) by mouth at bedtime. 90 tablet 0   ALPRAZolam  (XANAX ) 0.25 MG tablet Take 1 tablet (0.25 mg total) by mouth 2 (two) times daily as needed for anxiety. 30 tablet 0   nystatin  (MYCOSTATIN /NYSTOP ) powder Apply 1 Application topically 3 (three) times daily. (Patient not taking: Reported on 06/26/2024) 15 g 0   QUEtiapine  (SEROQUEL ) 50 MG tablet TAKE 1 TABLET BY MOUTH DAILY AS NEEDED. (Patient not taking: Reported on 09/09/2024) 30 tablet 0   No facility-administered medications prior to visit.    ROS Review of Systems  Constitutional:  Positive for fatigue and unexpected weight change (wt loss). Negative for chills, diaphoresis and fever.  HENT: Negative.    Eyes: Negative.   Respiratory:  Negative for cough, chest tightness, shortness of breath and wheezing.   Cardiovascular:  Negative for chest pain, palpitations and leg swelling.  Gastrointestinal:  Negative for abdominal pain, constipation, diarrhea, nausea and vomiting.  Endocrine: Negative.   Genitourinary: Negative.  Negative for difficulty urinating, dysuria and hematuria.  Musculoskeletal: Negative.  Negative for arthralgias and myalgias.  Skin: Negative.   Neurological:  Negative for dizziness.  Hematological:  Negative for adenopathy. Does not bruise/bleed easily.  Psychiatric/Behavioral:  Positive for behavioral problems, confusion and decreased  concentration. The patient is nervous/anxious.     Objective:  BP 124/78 (BP Location: Left Arm, Patient Position: Sitting, Cuff Size: Normal)   Pulse 83   Temp 97.7 F (36.5 C) (Oral)   Resp 16   Ht 5' 7 (1.702 m)   Wt 137 lb (62.1 kg)   SpO2 96%   BMI 21.46 kg/m   BP Readings from Last  3 Encounters:  09/09/24 124/78  06/26/24 134/85  04/11/24 (!) 140/83    Wt Readings from Last 3 Encounters:  09/09/24 137 lb (62.1 kg)  04/05/24 147 lb 0.8 oz (66.7 kg)  03/25/24 147 lb (66.7 kg)    Physical Exam Vitals reviewed.  Constitutional:      General: She is not in acute distress.    Appearance: She is ill-appearing. She is not toxic-appearing or diaphoretic.  HENT:     Nose: Nose normal.     Mouth/Throat:     Mouth: Mucous membranes are moist.  Eyes:     General: No scleral icterus.    Conjunctiva/sclera: Conjunctivae normal.  Cardiovascular:     Rate and Rhythm: Normal rate and regular rhythm.     Heart sounds: No murmur heard.    No friction rub. No gallop.  Pulmonary:     Effort: Pulmonary effort is normal.     Breath sounds: No stridor. No wheezing, rhonchi or rales.  Abdominal:     General: Abdomen is flat.     Palpations: There is no mass.     Tenderness: There is no abdominal tenderness. There is no guarding.     Hernia: No hernia is present.  Musculoskeletal:        General: Normal range of motion.     Cervical back: Neck supple.     Right lower leg: No edema.     Left lower leg: No edema.  Lymphadenopathy:     Cervical: No cervical adenopathy.  Neurological:     Mental Status: She is alert. Mental status is at baseline.  Psychiatric:        Attention and Perception: She is inattentive.        Mood and Affect: Mood is anxious. Affect is not flat or angry.        Speech: She is noncommunicative. Speech is not delayed or tangential.        Behavior: Behavior normal.        Cognition and Memory: Cognition is impaired. Memory is impaired. She exhibits impaired recent memory and impaired remote memory.        Judgment: Judgment normal.     Lab Results  Component Value Date   WBC 6.8 09/09/2024   HGB 12.6 09/09/2024   HCT 37.4 09/09/2024   PLT 238.0 09/09/2024   GLUCOSE 94 09/09/2024   ALT 29 09/09/2024   AST 39 (H) 09/09/2024   NA 141  09/09/2024   K 3.8 09/09/2024   CL 107 09/09/2024   CREATININE 0.70 09/09/2024   BUN 18 09/09/2024   CO2 28 09/09/2024   TSH 2.29 09/09/2024   Estimated Creatinine Clearance: 61.8 mL/min (by C-G formula based on SCr of 0.7 mg/dL).   Assessment & Plan:   Dementia with behavioral disturbance (HCC) -     CBC with Differential/Platelet; Future -     Rexulti; Take 1 tablet (0.5 mg total) by mouth daily for 7 days. -     Brexpiprazole; Take 1 tablet (1 mg total) by mouth daily. -     Brexpiprazole;  Take 1 tablet (2 mg total) by mouth daily.  Vascular parkinsonism (HCC)  Acquired hypothyroidism- She is euthyroid. -     CBC with Differential/Platelet; Future -     Basic metabolic panel with GFR; Future -     TSH; Future -     Hepatic function panel; Future     Follow-up: Return if symptoms worsen or fail to improve.  Debby Molt, MD

## 2024-09-09 NOTE — Patient Instructions (Signed)
 0.5 mg daily for one week 1 mg daily for one week Then 2 mg dailyHypothyroidism  Hypothyroidism is when the thyroid  gland does not make enough of certain hormones. This is called an underactive thyroid . The thyroid  gland is a small gland located in the lower front part of the neck, just in front of the windpipe (trachea). This gland makes hormones that help control how the body uses food for energy (metabolism) as well as how the heart and brain function. These hormones also play a role in keeping your bones strong. When the thyroid  is underactive, it produces too little of the hormones thyroxine (T4) and triiodothyronine (T3). What are the causes? This condition may be caused by: Hashimoto's disease. This is a disease in which the body's disease-fighting system (immune system) attacks the thyroid  gland. This is the most common cause. Viral infections. Pregnancy. Certain medicines. Birth defects. Problems with a gland in the center of the brain (pituitary gland). Lack of enough iodine in the diet. Other causes may include: Past radiation treatments to the head or neck for cancer. Past treatment with radioactive iodine. Past exposure to radiation in the environment. Past surgical removal of part or all of the thyroid . What increases the risk? You are more likely to develop this condition if: You are female. You have a family history of thyroid  conditions. You use a medicine called lithium. You take medicines that affect the immune system (immunosuppressants). What are the signs or symptoms? Common symptoms of this condition include: Not being able to tolerate cold. Feeling as though you have no energy (lethargy). Lack of appetite. Constipation. Sadness or depression. Weight gain that is not explained by a change in diet or exercise habits. Menstrual irregularity. Dry skin, coarse hair, or brittle nails. Other symptoms may include: Muscle pain. Slowing of thought processes. Poor  memory. How is this diagnosed? This condition may be diagnosed based on: Your symptoms, your medical history, and a physical exam. Blood tests. You may also have imaging tests, such as an ultrasound or MRI. How is this treated? This condition is treated with medicine that replaces the thyroid  hormones that your body does not make. After you begin treatment, it may take several weeks for symptoms to go away. Follow these instructions at home: Take over-the-counter and prescription medicines only as told by your health care provider. If you start taking any new medicines, tell your health care provider. Keep all follow-up visits as told by your health care provider. This is important. As your condition improves, your dosage of thyroid  hormone medicine may change. You will need to have blood tests regularly so that your health care provider can monitor your condition. Contact a health care provider if: Your symptoms do not get better with treatment. You are taking thyroid  hormone replacement medicine and you: Sweat a lot. Have tremors. Feel anxious. Lose weight rapidly. Cannot tolerate heat. Have emotional swings. Have diarrhea. Feel weak. Get help right away if: You have chest pain. You have an irregular heartbeat. You have a rapid heartbeat. You have difficulty breathing. These symptoms may be an emergency. Get help right away. Call 911. Do not wait to see if the symptoms will go away. Do not drive yourself to the hospital. Summary Hypothyroidism is when the thyroid  gland does not make enough of certain hormones (it is underactive). When the thyroid  is underactive, it produces too little of the hormones thyroxine (T4) and triiodothyronine (T3). The most common cause is Hashimoto's disease, a disease in which the  body's disease-fighting system (immune system) attacks the thyroid  gland. The condition can also be caused by viral infections, medicine, pregnancy, or past radiation  treatment to the head or neck. Symptoms may include weight gain, dry skin, constipation, feeling as though you do not have energy, and not being able to tolerate cold. This condition is treated with medicine to replace the thyroid  hormones that your body does not make. This information is not intended to replace advice given to you by your health care provider. Make sure you discuss any questions you have with your health care provider. Document Revised: 09/19/2021 Document Reviewed: 09/19/2021 Elsevier Patient Education  2024 [employer].

## 2024-09-11 NOTE — Telephone Encounter (Signed)
 Copied from CRM #[id number]. Topic: Appointments - Scheduling Inquiry for Clinic >> Sep 11, 2024 12:05 PM Drema MATSU wrote: Reason for CRM: Patient husband is wanting to schedule an appt for wife to have a Mantoux or Quantiferon test done in order to be entered into assisted living/memory care. He has left pcp a message on Mychart yesterday regarding matter. Please call husband.

## 2024-09-12 ENCOUNTER — Other Ambulatory Visit: Payer: Self-pay | Admitting: Internal Medicine

## 2024-09-12 ENCOUNTER — Ambulatory Visit (HOSPITAL_COMMUNITY): Payer: Self-pay

## 2024-09-12 DIAGNOSIS — Z111 Encounter for screening for respiratory tuberculosis: Secondary | ICD-10-CM

## 2024-09-14 ENCOUNTER — Other Ambulatory Visit

## 2024-09-15 ENCOUNTER — Ambulatory Visit: Admitting: Internal Medicine

## 2024-09-15 ENCOUNTER — Other Ambulatory Visit: Payer: Self-pay | Admitting: Internal Medicine

## 2024-09-15 NOTE — Telephone Encounter (Signed)
 Received forms from Dr. Rollene. Awaiting TB test results to complete.

## 2024-09-17 LAB — QUANTIFERON-TB GOLD PLUS
Mitogen-NIL: 5.62 [IU]/mL
NIL: 0.05 [IU]/mL
QuantiFERON-TB Gold Plus: NEGATIVE
TB1-NIL: 0.01 [IU]/mL
TB2-NIL: <0 [IU]/mL

## 2024-09-18 ENCOUNTER — Ambulatory Visit: Payer: Self-pay | Admitting: Internal Medicine

## 2024-10-05 DIAGNOSIS — F03918 Unspecified dementia, unspecified severity, with other behavioral disturbance: Secondary | ICD-10-CM

## 2024-10-05 MED ORDER — BREXPIPRAZOLE 1 MG PO TABS: 1.0000 mg | ORAL_TABLET | Freq: Every day | ORAL | 1 refills | Status: AC

## 2024-10-14 ENCOUNTER — Telehealth: Payer: Self-pay

## 2024-10-14 NOTE — Telephone Encounter (Signed)
 Copied from CRM [id number]. Topic: Clinical - Medical Advice >> Oct 14, 2024  1:35 PM Deleta RAMAN wrote: Reason for CRM: Lesley MATSU from [facility] is calling due to patient husband emailing facility to stop medication and refusing all medication. He believes brexpiprazole  (REXULTI ) 1 MG TABS tablet is causing her to be tired and sleeps too often during visits. Please contact mariah at [phone] or [phone]

## 2024-10-16 NOTE — Telephone Encounter (Signed)
 Ok to stop rexulti  and continue other meds.

## 2024-10-16 NOTE — Telephone Encounter (Signed)
 Spoke with husband and is aware that it is ok to stop Rexulti  and continue other meds per Dr. Rollene. Verbalized understanding.

## 2025-02-01 ENCOUNTER — Ambulatory Visit

## 2025-03-16 ENCOUNTER — Inpatient Hospital Stay: Admitting: Hematology and Oncology
